# Patient Record
Sex: Male | Born: 1955 | Race: White | Hispanic: Yes | Marital: Married | Smoking: Former smoker
Health system: Southern US, Community
[De-identification: ages and names within clinical notes are randomized; demographics above are authoritative.]

## PROBLEM LIST (undated history)

## (undated) ENCOUNTER — Ambulatory Visit
Admission: RE | Disposition: A | Discharge status: Home / Self Care | Payer: BC Managed Care – PPO | Source: Ambulatory Visit | Attending: Emergency Medicine | Admitting: Emergency Medicine

## (undated) ENCOUNTER — Ambulatory Visit: Discharge status: Absent without leave | Payer: BC Managed Care – PPO

## (undated) DIAGNOSIS — C61 Malignant neoplasm of prostate: Secondary | ICD-10-CM

## (undated) DIAGNOSIS — C8330 Diffuse large B-cell lymphoma, unspecified site: Secondary | ICD-10-CM

## (undated) DIAGNOSIS — K219 Gastro-esophageal reflux disease without esophagitis: Secondary | ICD-10-CM

## (undated) DIAGNOSIS — C801 Malignant (primary) neoplasm, unspecified: Secondary | ICD-10-CM

## (undated) HISTORY — DX: Malignant neoplasm of prostate: C61

## (undated) HISTORY — PX: HERNIA REPAIR: SHX51

## (undated) HISTORY — PX: OTHER SURGICAL HISTORY: SHX169

## (undated) HISTORY — PX: COLONOSCOPY: SHX174

## (undated) HISTORY — DX: Gastro-esophageal reflux disease without esophagitis: K21.9

## (undated) HISTORY — DX: Diffuse large B-cell lymphoma, unspecified site: C83.30

---

## 2009-10-15 ENCOUNTER — Ambulatory Visit (HOSPITAL_COMMUNITY): Admission: RE | Admit: 2009-10-15 | Discharge: 2009-10-15 | Payer: Self-pay | Admitting: Family Medicine

## 2011-01-19 ENCOUNTER — Other Ambulatory Visit (HOSPITAL_COMMUNITY): Payer: Self-pay | Admitting: Urology

## 2011-01-19 DIAGNOSIS — C61 Malignant neoplasm of prostate: Secondary | ICD-10-CM

## 2011-01-20 ENCOUNTER — Encounter (HOSPITAL_COMMUNITY)
Admission: RE | Admit: 2011-01-20 | Discharge: 2011-01-20 | Disposition: A | Discharge status: Home / Self Care | Payer: 59 | Source: Ambulatory Visit | Attending: Urology | Admitting: Urology

## 2011-01-20 ENCOUNTER — Encounter (HOSPITAL_COMMUNITY): Payer: Self-pay

## 2011-01-20 DIAGNOSIS — C61 Malignant neoplasm of prostate: Secondary | ICD-10-CM

## 2011-01-20 HISTORY — DX: Malignant (primary) neoplasm, unspecified: C80.1

## 2011-01-20 MED ORDER — TECHNETIUM TC 99M MEDRONATE IV KIT
25.0000 | PACK | Freq: Once | INTRAVENOUS | Status: AC | PRN
  Administered 2011-01-20: 24.5 via INTRAVENOUS

## 2011-02-11 DIAGNOSIS — C61 Malignant neoplasm of prostate: Secondary | ICD-10-CM | POA: Insufficient documentation

## 2011-11-15 ENCOUNTER — Other Ambulatory Visit (HOSPITAL_COMMUNITY): Payer: Self-pay | Admitting: Internal Medicine

## 2011-11-15 DIAGNOSIS — K219 Gastro-esophageal reflux disease without esophagitis: Secondary | ICD-10-CM

## 2011-11-15 DIAGNOSIS — R1013 Epigastric pain: Secondary | ICD-10-CM

## 2011-11-16 ENCOUNTER — Ambulatory Visit (HOSPITAL_COMMUNITY)
Admission: RE | Admit: 2011-11-16 | Discharge: 2011-11-16 | Disposition: A | Discharge status: Home / Self Care | Payer: 59 | Source: Ambulatory Visit | Attending: Internal Medicine | Admitting: Internal Medicine

## 2011-11-16 DIAGNOSIS — K219 Gastro-esophageal reflux disease without esophagitis: Secondary | ICD-10-CM | POA: Insufficient documentation

## 2011-11-16 DIAGNOSIS — K8020 Calculus of gallbladder without cholecystitis without obstruction: Secondary | ICD-10-CM | POA: Insufficient documentation

## 2011-11-16 DIAGNOSIS — R1013 Epigastric pain: Secondary | ICD-10-CM | POA: Insufficient documentation

## 2011-11-18 ENCOUNTER — Other Ambulatory Visit (HOSPITAL_COMMUNITY): Payer: 59

## 2011-11-24 ENCOUNTER — Encounter (INDEPENDENT_AMBULATORY_CARE_PROVIDER_SITE_OTHER): Payer: Self-pay | Admitting: *Deleted

## 2011-11-29 ENCOUNTER — Ambulatory Visit (INDEPENDENT_AMBULATORY_CARE_PROVIDER_SITE_OTHER): Payer: 59 | Admitting: Internal Medicine

## 2011-11-29 ENCOUNTER — Encounter (INDEPENDENT_AMBULATORY_CARE_PROVIDER_SITE_OTHER): Payer: Self-pay | Admitting: Internal Medicine

## 2011-11-29 VITALS — BP 90/58 | HR 66 | Temp 97.9°F | Ht 65.0 in | Wt 190.1 lb

## 2011-11-29 DIAGNOSIS — R9389 Abnormal findings on diagnostic imaging of other specified body structures: Secondary | ICD-10-CM

## 2011-11-29 DIAGNOSIS — A048 Other specified bacterial intestinal infections: Secondary | ICD-10-CM

## 2011-11-29 DIAGNOSIS — R1013 Epigastric pain: Secondary | ICD-10-CM

## 2011-11-29 DIAGNOSIS — K8689 Other specified diseases of pancreas: Secondary | ICD-10-CM

## 2011-11-29 DIAGNOSIS — K869 Disease of pancreas, unspecified: Secondary | ICD-10-CM

## 2011-11-29 NOTE — Patient Instructions (Addendum)
MRI abdomen

## 2011-11-29 NOTE — Progress Notes (Addendum)
Subjective:     Patient ID: Brandon Norton, male   DOB: 1955/07/08, 56 y.o.   MRN: [id number]  [facility] to our [facility]. He has been hurting for 3-4 months.  Recently noted to be H. Pylori positive and antibiotic tx has been started. He has not lost any weight. His appetite is good.  He has epigastric pain which comes and goes. Sometimes the pain is worse after eating. He does have some early satiety 11/13/2011 NA 137, K 3.9, Bili 1.2, ALP 91, AST 19, ALT 34.  Hem. 12.9, HCT 36.7, MCV 88.9. H. Pylori detected.   11/13/2010 Amylase 39, Lipase 21 11/15/11 US abdomen: IMPRESSION:  1. Cystic lesion and pancreatic head. Possible hypoechoic mass in  the pancreatic body. Pancreatic adenocarcinoma or cystic  pancreatic malignancy not completely excluded. Pancreatic protocol  MRI with and without contrast recommended for further  characterization.  2. Heterogeneous liver, likely due to fatty infiltration with  geographic fatty sparing.  3. Questionable focal distal dilatation the common bile duct,  query type 1 choledochal cyst.  4. Multiple gallstones. Mild gallbladder wall thickening without  pericholecystic fluid or sonographic Murphy's sign. Correlate  clinically in assessing for acute cholecystitis.  Review of Systems see hpi Current Outpatient Prescriptions  Medication Sig Dispense Refill  . amoxicillin (AMOXIL) 500 MG capsule Take 500 mg by mouth 2 (two) times daily.      . clarithromycin (BIAXIN) 500 MG tablet Take 500 mg by mouth 2 (two) times daily.      Marland Kitchen esomeprazole (NEXIUM) 40 MG capsule Take 40 mg by mouth daily before breakfast.      . nabumetone (RELAFEN) 750 MG tablet Take 750 mg by mouth daily.      . Tamsulosin HCl (FLOMAX) 0.4 MG CAPS Take by mouth.       History reviewed. No pertinent past surgical history. Past Medical History  Diagnosis Date  . Cancer   . Prostate cancer   . GERD (gastroesophageal reflux disease)    No  Known Allergies     . Objective:   Physical Exam Filed Vitals:   11/29/11 1051  BP: 90/58  Pulse: 66  Temp: 97.9 F (36.6 C)  Height: 5\' 5"  (1.651 m)  Weight: 190 lb 1.6 oz (86.229 kg)   Alert and oriented. Skin warm and dry. Oral mucosa is moist.   . Sclera anicteric, conjunctivae is pink. Thyroid not enlarged. No cervical lymphadenopathy. Lungs clear. Heart regular rate and rhythm.  Abdomen is soft. Bowel sounds are positive. No hepatomegaly. No abdominal masses felt. He has epigastric tenderness.  No edema to lower extremities.       Assessment:  Epigastric tenderness. H. Pylori positive and presently taking antibiotics. Abnormal [place] with hypoechoic mass in the pancreatic body. CBD dilatation up to 1.1cm. I discussed this case with Dr. Karilyn Cota.  Pancreatic adenocarcinoma needs to be ruled out. Also CBD stone needs to be ruled out. After MRI, may need cholecystectomy    Plan:     MRI abdomen w/wo contrast pancreatic protocol.  Further recommendations to follow. Possible cholecystectomy.

## 2011-12-08 ENCOUNTER — Other Ambulatory Visit (HOSPITAL_COMMUNITY): Payer: 59

## 2011-12-14 ENCOUNTER — Encounter (INDEPENDENT_AMBULATORY_CARE_PROVIDER_SITE_OTHER): Payer: Self-pay

## 2011-12-14 ENCOUNTER — Ambulatory Visit (HOSPITAL_COMMUNITY)
Admission: RE | Admit: 2011-12-14 | Discharge: 2011-12-14 | Disposition: A | Discharge status: Home / Self Care | Payer: 59 | Source: Ambulatory Visit | Attending: Internal Medicine | Admitting: Internal Medicine

## 2011-12-14 ENCOUNTER — Other Ambulatory Visit (INDEPENDENT_AMBULATORY_CARE_PROVIDER_SITE_OTHER): Payer: Self-pay | Admitting: Internal Medicine

## 2011-12-14 DIAGNOSIS — K8689 Other specified diseases of pancreas: Secondary | ICD-10-CM

## 2011-12-14 DIAGNOSIS — R1013 Epigastric pain: Secondary | ICD-10-CM

## 2011-12-14 DIAGNOSIS — K8020 Calculus of gallbladder without cholecystitis without obstruction: Secondary | ICD-10-CM | POA: Insufficient documentation

## 2011-12-14 MED ORDER — GADOBENATE DIMEGLUMINE 529 MG/ML IV SOLN
18.0000 mL | Freq: Once | INTRAVENOUS | Status: AC | PRN
  Administered 2011-12-14: 18 mL via INTRAVENOUS

## 2011-12-16 ENCOUNTER — Telehealth (INDEPENDENT_AMBULATORY_CARE_PROVIDER_SITE_OTHER): Payer: Self-pay | Admitting: *Deleted

## 2011-12-16 NOTE — Telephone Encounter (Signed)
Trenden's daughter, Donn Pierini, received a call two days ago and called back yesterday left a message. She has not spoke with another about the results from her dads MRI. Then today she received a call from [facility] stating our office referred her dad there. She is needing to speak with someone to see what is going on please. Her return phone number is [phone].

## 2011-12-16 NOTE — Telephone Encounter (Signed)
This has been addressed.

## 2011-12-27 ENCOUNTER — Telehealth (INDEPENDENT_AMBULATORY_CARE_PROVIDER_SITE_OTHER): Payer: Self-pay | Admitting: Internal Medicine

## 2011-12-27 NOTE — Telephone Encounter (Signed)
Opened in error

## 2012-01-28 ENCOUNTER — Other Ambulatory Visit (HOSPITAL_COMMUNITY): Payer: Self-pay | Admitting: Oncology

## 2012-01-28 ENCOUNTER — Encounter (HOSPITAL_COMMUNITY): Payer: Self-pay | Admitting: Oncology

## 2012-01-28 DIAGNOSIS — C8330 Diffuse large B-cell lymphoma, unspecified site: Secondary | ICD-10-CM

## 2012-01-28 HISTORY — DX: Diffuse large B-cell lymphoma, unspecified site: C83.30

## 2012-01-28 MED ORDER — PEGFILGRASTIM INJECTION 6 MG/0.6ML: 6.0000 mg | Freq: Once | SUBCUTANEOUS | Status: DC

## 2012-02-03 ENCOUNTER — Other Ambulatory Visit (HOSPITAL_COMMUNITY): Payer: 59

## 2012-02-03 ENCOUNTER — Encounter (HOSPITAL_COMMUNITY): Payer: 59 | Attending: Oncology

## 2012-02-03 ENCOUNTER — Encounter (HOSPITAL_BASED_OUTPATIENT_CLINIC_OR_DEPARTMENT_OTHER): Payer: 59

## 2012-02-03 VITALS — BP 107/67 | HR 62 | Temp 98.0°F | Resp 16

## 2012-02-03 DIAGNOSIS — C8330 Diffuse large B-cell lymphoma, unspecified site: Secondary | ICD-10-CM

## 2012-02-03 DIAGNOSIS — C8589 Other specified types of non-Hodgkin lymphoma, extranodal and solid organ sites: Secondary | ICD-10-CM

## 2012-02-03 LAB — CBC WITH DIFFERENTIAL/PLATELET
Basophils Relative: 0 % (ref 0–1)
HCT: 28.4 % — ABNORMAL LOW (ref 39.0–52.0)
Hemoglobin: 9.8 g/dL — ABNORMAL LOW (ref 13.0–17.0)
Lymphocytes Relative: 17 % (ref 12–46)
MCHC: 34.5 g/dL (ref 30.0–36.0)
Monocytes Relative: 0 % — ABNORMAL LOW (ref 3–12)
Neutro Abs: 3.6 10*3/uL (ref 1.7–7.7)
Neutrophils Relative %: 79 % — ABNORMAL HIGH (ref 43–77)
RBC: 3.21 MIL/uL — ABNORMAL LOW (ref 4.22–5.81)
WBC: 4.6 10*3/uL (ref 4.0–10.5)

## 2012-02-03 LAB — COMPREHENSIVE METABOLIC PANEL
AST: 11 U/L (ref 0–37)
Albumin: 2.7 g/dL — ABNORMAL LOW (ref 3.5–5.2)
Alkaline Phosphatase: 74 U/L (ref 39–117)
BUN: 9 mg/dL (ref 6–23)
CO2: 33 mEq/L — ABNORMAL HIGH (ref 19–32)
Chloride: 97 mEq/L (ref 96–112)
GFR calc non Af Amer: >90 mL/min (ref >90)
Potassium: 3.1 mEq/L — ABNORMAL LOW (ref 3.5–5.1)
Total Bilirubin: 0.4 mg/dL (ref 0.3–1.2)

## 2012-02-03 LAB — MAGNESIUM: Magnesium: 2.1 mg/dL (ref 1.5–2.5)

## 2012-02-03 MED ORDER — PEGFILGRASTIM INJECTION 6 MG/0.6ML
SUBCUTANEOUS | Status: AC
  Filled 2012-02-03: qty 0.6

## 2012-02-03 MED ORDER — PEGFILGRASTIM INJECTION 6 MG/0.6ML
6.0000 mg | Freq: Once | SUBCUTANEOUS | Status: AC
  Administered 2012-02-03: 6 mg via SUBCUTANEOUS

## 2012-02-03 NOTE — Progress Notes (Signed)
Brandon Norton presents today for injection per MD orders. Neulasta 6mg  administered SQ in right Abdomen. Administration without incident. Patient tolerated well. Patient's daughter called to inform of lab results and lab results sent to [facility] per number provided.

## 2012-02-03 NOTE — Progress Notes (Signed)
Labs drawn today for cbc/diff,cmp,mg 

## 2012-02-07 ENCOUNTER — Other Ambulatory Visit (HOSPITAL_COMMUNITY): Payer: 59

## 2012-02-10 ENCOUNTER — Other Ambulatory Visit (HOSPITAL_COMMUNITY): Payer: 59

## 2012-02-14 ENCOUNTER — Other Ambulatory Visit (HOSPITAL_COMMUNITY): Payer: 59

## 2012-02-17 ENCOUNTER — Other Ambulatory Visit (HOSPITAL_COMMUNITY): Payer: Self-pay | Admitting: Oncology

## 2012-02-17 DIAGNOSIS — C8330 Diffuse large B-cell lymphoma, unspecified site: Secondary | ICD-10-CM

## 2012-02-23 ENCOUNTER — Encounter (HOSPITAL_BASED_OUTPATIENT_CLINIC_OR_DEPARTMENT_OTHER): Payer: 59

## 2012-02-23 ENCOUNTER — Encounter (HOSPITAL_COMMUNITY): Payer: 59 | Attending: Oncology

## 2012-02-23 VITALS — BP 107/68 | HR 74 | Temp 97.5°F

## 2012-02-23 DIAGNOSIS — C8589 Other specified types of non-Hodgkin lymphoma, extranodal and solid organ sites: Secondary | ICD-10-CM

## 2012-02-23 DIAGNOSIS — Z5189 Encounter for other specified aftercare: Secondary | ICD-10-CM

## 2012-02-23 DIAGNOSIS — C8330 Diffuse large B-cell lymphoma, unspecified site: Secondary | ICD-10-CM

## 2012-02-23 LAB — COMPREHENSIVE METABOLIC PANEL
ALT: 15 U/L (ref 0–53)
Albumin: 3.4 g/dL — ABNORMAL LOW (ref 3.5–5.2)
Calcium: 8.9 mg/dL (ref 8.4–10.5)
GFR calc Af Amer: >90 mL/min (ref >90)
Glucose, Bld: 119 mg/dL — ABNORMAL HIGH (ref 70–99)
Potassium: 3 mEq/L — ABNORMAL LOW (ref 3.5–5.1)
Sodium: 138 mEq/L (ref 135–145)
Total Protein: 6.7 g/dL (ref 6.0–8.3)

## 2012-02-23 LAB — CBC WITH DIFFERENTIAL/PLATELET
Basophils Absolute: 0 10*3/uL (ref 0.0–0.1)
Basophils Relative: 0 % (ref 0–1)
Eosinophils Absolute: 0.1 10*3/uL (ref 0.0–0.7)
Eosinophils Relative: 2 % (ref 0–5)
Lymphs Abs: 0.8 10*3/uL (ref 0.7–4.0)
MCH: 30 pg (ref 26.0–34.0)
MCHC: 34.1 g/dL (ref 30.0–36.0)
MCV: 87.8 fL (ref 78.0–100.0)
Neutrophils Relative %: 81 % — ABNORMAL HIGH (ref 43–77)
Platelets: 328 10*3/uL (ref 150–400)
RDW: 13.4 % (ref 11.5–15.5)

## 2012-02-23 MED ORDER — PEGFILGRASTIM INJECTION 6 MG/0.6ML
SUBCUTANEOUS | Status: AC
  Filled 2012-02-23: qty 0.6

## 2012-02-23 MED ORDER — PEGFILGRASTIM INJECTION 6 MG/0.6ML
6.0000 mg | Freq: Once | SUBCUTANEOUS | Status: AC
  Administered 2012-02-23: 6 mg via SUBCUTANEOUS

## 2012-02-23 NOTE — Progress Notes (Signed)
Labs drawn today for cbc/diff,cmp,mg 

## 2012-02-23 NOTE — Progress Notes (Signed)
Lab results faxed to [facility].

## 2012-02-23 NOTE — Progress Notes (Signed)
Brandon Norton presents today for injection per the provider's orders.  Neulasta administered administration without incident; see MAR for injection details.  Patient tolerated procedure well and without incident.  No questions or complaints noted at this time.

## 2012-02-28 ENCOUNTER — Encounter (HOSPITAL_BASED_OUTPATIENT_CLINIC_OR_DEPARTMENT_OTHER): Payer: 59

## 2012-02-28 DIAGNOSIS — C8589 Other specified types of non-Hodgkin lymphoma, extranodal and solid organ sites: Secondary | ICD-10-CM

## 2012-02-28 DIAGNOSIS — C8330 Diffuse large B-cell lymphoma, unspecified site: Secondary | ICD-10-CM

## 2012-02-28 LAB — COMPREHENSIVE METABOLIC PANEL
BUN: 5 mg/dL — ABNORMAL LOW (ref 6–23)
Calcium: 9.4 mg/dL (ref 8.4–10.5)
Creatinine, Ser: 0.59 mg/dL (ref 0.50–1.35)
GFR calc Af Amer: >90 mL/min (ref >90)
Glucose, Bld: 115 mg/dL — ABNORMAL HIGH (ref 70–99)
Total Protein: 7.2 g/dL (ref 6.0–8.3)

## 2012-02-28 LAB — CBC WITH DIFFERENTIAL/PLATELET
Basophils Absolute: 0.1 10*3/uL (ref 0.0–0.1)
Basophils Relative: 2 % — ABNORMAL HIGH (ref 0–1)
Eosinophils Absolute: 0.1 10*3/uL (ref 0.0–0.7)
HCT: 27.4 % — ABNORMAL LOW (ref 39.0–52.0)
Hemoglobin: 9.3 g/dL — ABNORMAL LOW (ref 13.0–17.0)
MCH: 29.8 pg (ref 26.0–34.0)
MCHC: 33.9 g/dL (ref 30.0–36.0)
Monocytes Absolute: 0.7 10*3/uL (ref 0.1–1.0)
Monocytes Relative: 19 % — ABNORMAL HIGH (ref 3–12)
RDW: 13.5 % (ref 11.5–15.5)

## 2012-02-28 LAB — MAGNESIUM: Magnesium: 2 mg/dL (ref 1.5–2.5)

## 2012-02-28 NOTE — Progress Notes (Signed)
Labs drawn today for cbc/diff,cmp,mg 

## 2012-03-02 ENCOUNTER — Encounter (HOSPITAL_BASED_OUTPATIENT_CLINIC_OR_DEPARTMENT_OTHER): Payer: 59

## 2012-03-02 DIAGNOSIS — C8330 Diffuse large B-cell lymphoma, unspecified site: Secondary | ICD-10-CM

## 2012-03-02 DIAGNOSIS — C8589 Other specified types of non-Hodgkin lymphoma, extranodal and solid organ sites: Secondary | ICD-10-CM

## 2012-03-02 LAB — CBC WITH DIFFERENTIAL/PLATELET
Basophils Relative: 1 % (ref 0–1)
Hemoglobin: 9.7 g/dL — ABNORMAL LOW (ref 13.0–17.0)
Lymphocytes Relative: 16 % (ref 12–46)
Lymphs Abs: 1.1 10*3/uL (ref 0.7–4.0)
MCHC: 33.3 g/dL (ref 30.0–36.0)
Monocytes Relative: 11 % (ref 3–12)
Neutro Abs: 4.8 10*3/uL (ref 1.7–7.7)
Neutrophils Relative %: 69 % (ref 43–77)
RBC: 3.24 MIL/uL — ABNORMAL LOW (ref 4.22–5.81)
WBC: 7 10*3/uL (ref 4.0–10.5)

## 2012-03-02 LAB — COMPREHENSIVE METABOLIC PANEL
ALT: 11 U/L (ref 0–53)
AST: 11 U/L (ref 0–37)
CO2: 30 mEq/L (ref 19–32)
Calcium: 9.5 mg/dL (ref 8.4–10.5)
GFR calc non Af Amer: >90 mL/min (ref >90)
Sodium: 139 mEq/L (ref 135–145)
Total Protein: 6.9 g/dL (ref 6.0–8.3)

## 2012-03-02 LAB — MAGNESIUM: Magnesium: 2 mg/dL (ref 1.5–2.5)

## 2012-03-02 NOTE — Progress Notes (Signed)
Labs drawn today for cbc/diff,cmp,mg 

## 2012-03-06 ENCOUNTER — Encounter (HOSPITAL_BASED_OUTPATIENT_CLINIC_OR_DEPARTMENT_OTHER): Payer: 59

## 2012-03-06 DIAGNOSIS — C8589 Other specified types of non-Hodgkin lymphoma, extranodal and solid organ sites: Secondary | ICD-10-CM

## 2012-03-06 DIAGNOSIS — C8330 Diffuse large B-cell lymphoma, unspecified site: Secondary | ICD-10-CM

## 2012-03-06 LAB — CBC WITH DIFFERENTIAL/PLATELET
Basophils Relative: 1 % (ref 0–1)
Eosinophils Absolute: 0.3 10*3/uL (ref 0.0–0.7)
Eosinophils Relative: 4 % (ref 0–5)
HCT: 31 % — ABNORMAL LOW (ref 39.0–52.0)
Hemoglobin: 10.2 g/dL — ABNORMAL LOW (ref 13.0–17.0)
MCH: 30.2 pg (ref 26.0–34.0)
MCHC: 32.9 g/dL (ref 30.0–36.0)
Monocytes Absolute: 0.6 10*3/uL (ref 0.1–1.0)
Monocytes Relative: 8 % (ref 3–12)

## 2012-03-06 LAB — COMPREHENSIVE METABOLIC PANEL
Alkaline Phosphatase: 108 U/L (ref 39–117)
BUN: 8 mg/dL (ref 6–23)
CO2: 29 mEq/L (ref 19–32)
Chloride: 101 mEq/L (ref 96–112)
GFR calc Af Amer: >90 mL/min (ref >90)
Glucose, Bld: 110 mg/dL — ABNORMAL HIGH (ref 70–99)
Potassium: 3.8 mEq/L (ref 3.5–5.1)
Total Bilirubin: 0.3 mg/dL (ref 0.3–1.2)

## 2012-03-06 LAB — MAGNESIUM: Magnesium: 2.1 mg/dL (ref 1.5–2.5)

## 2012-03-06 NOTE — Progress Notes (Signed)
Labs drawn today for cbc/diff,cmp,mg 

## 2012-03-15 ENCOUNTER — Encounter (HOSPITAL_COMMUNITY): Payer: 59 | Attending: Oncology

## 2012-03-15 ENCOUNTER — Other Ambulatory Visit (HOSPITAL_COMMUNITY): Payer: 59

## 2012-03-15 ENCOUNTER — Encounter (HOSPITAL_COMMUNITY): Payer: 59

## 2012-03-15 VITALS — BP 99/68 | HR 74

## 2012-03-15 DIAGNOSIS — C8330 Diffuse large B-cell lymphoma, unspecified site: Secondary | ICD-10-CM

## 2012-03-15 DIAGNOSIS — Z5189 Encounter for other specified aftercare: Secondary | ICD-10-CM

## 2012-03-15 DIAGNOSIS — C8589 Other specified types of non-Hodgkin lymphoma, extranodal and solid organ sites: Secondary | ICD-10-CM | POA: Insufficient documentation

## 2012-03-15 LAB — CBC WITH DIFFERENTIAL/PLATELET
Basophils Relative: 0 % (ref 0–1)
Eosinophils Absolute: 0 10*3/uL (ref 0.0–0.7)
Eosinophils Relative: 1 % (ref 0–5)
MCH: 32 pg (ref 26.0–34.0)
MCHC: 36.5 g/dL — ABNORMAL HIGH (ref 30.0–36.0)
Monocytes Relative: 0 % — ABNORMAL LOW (ref 3–12)
Neutrophils Relative %: 90 % — ABNORMAL HIGH (ref 43–77)
Platelets: 349 10*3/uL (ref 150–400)

## 2012-03-15 LAB — COMPREHENSIVE METABOLIC PANEL
Albumin: 3.5 g/dL (ref 3.5–5.2)
Alkaline Phosphatase: 79 U/L (ref 39–117)
BUN: 11 mg/dL (ref 6–23)
Calcium: 8.8 mg/dL (ref 8.4–10.5)
Potassium: 3.3 mEq/L — ABNORMAL LOW (ref 3.5–5.1)
Total Protein: 6.8 g/dL (ref 6.0–8.3)

## 2012-03-15 MED ORDER — PEGFILGRASTIM INJECTION 6 MG/0.6ML
SUBCUTANEOUS | Status: AC
  Filled 2012-03-15: qty 0.6

## 2012-03-15 MED ORDER — PALONOSETRON HCL INJECTION 0.25 MG/5ML
INTRAVENOUS | Status: AC
  Filled 2012-03-15: qty 5

## 2012-03-15 MED ORDER — PEGFILGRASTIM INJECTION 6 MG/0.6ML
6.0000 mg | Freq: Once | SUBCUTANEOUS | Status: AC
  Administered 2012-03-15: 6 mg via SUBCUTANEOUS

## 2012-03-15 NOTE — Progress Notes (Signed)
Brandon Norton presents today for injection per MD orders. Neulasta 6mg  administered SQ in left Abdomen. Administration without incident. Patient tolerated well. Specimen withdrawn peripherally from right ac.  Tolerated well.

## 2012-03-20 ENCOUNTER — Encounter (HOSPITAL_BASED_OUTPATIENT_CLINIC_OR_DEPARTMENT_OTHER): Payer: 59

## 2012-03-20 ENCOUNTER — Other Ambulatory Visit (HOSPITAL_COMMUNITY): Payer: Self-pay | Admitting: Oncology

## 2012-03-20 DIAGNOSIS — C8330 Diffuse large B-cell lymphoma, unspecified site: Secondary | ICD-10-CM

## 2012-03-20 DIAGNOSIS — C8589 Other specified types of non-Hodgkin lymphoma, extranodal and solid organ sites: Secondary | ICD-10-CM

## 2012-03-20 LAB — COMPREHENSIVE METABOLIC PANEL
ALT: 31 U/L (ref 0–53)
CO2: 28 mEq/L (ref 19–32)
Calcium: 9 mg/dL (ref 8.4–10.5)
Chloride: 96 mEq/L (ref 96–112)
Creatinine, Ser: 0.58 mg/dL (ref 0.50–1.35)
GFR calc Af Amer: >90 mL/min (ref >90)
GFR calc non Af Amer: >90 mL/min (ref >90)
Glucose, Bld: 129 mg/dL — ABNORMAL HIGH (ref 70–99)
Sodium: 134 mEq/L — ABNORMAL LOW (ref 135–145)
Total Bilirubin: 0.4 mg/dL (ref 0.3–1.2)

## 2012-03-20 LAB — CBC WITH DIFFERENTIAL/PLATELET
Basophils Relative: 1 % (ref 0–1)
Eosinophils Relative: 3 % (ref 0–5)
Hemoglobin: 9 g/dL — ABNORMAL LOW (ref 13.0–17.0)
Lymphs Abs: 0.5 10*3/uL — ABNORMAL LOW (ref 0.7–4.0)
MCH: 30.7 pg (ref 26.0–34.0)
MCV: 88.7 fL (ref 78.0–100.0)
Monocytes Absolute: 0.4 10*3/uL (ref 0.1–1.0)
Monocytes Relative: 20 % — ABNORMAL HIGH (ref 3–12)
RBC: 2.93 MIL/uL — ABNORMAL LOW (ref 4.22–5.81)
WBC: 1.9 10*3/uL — ABNORMAL LOW (ref 4.0–10.5)

## 2012-03-20 NOTE — Progress Notes (Signed)
Brandon Norton presented for [employer]. Labs per MD order drawn via Peripheral Line 23 gauge needle inserted in Right AC  Good blood return present. Procedure without incident.  Needle removed intact. Patient tolerated procedure well.

## 2012-03-23 ENCOUNTER — Encounter (HOSPITAL_BASED_OUTPATIENT_CLINIC_OR_DEPARTMENT_OTHER): Payer: 59

## 2012-03-23 DIAGNOSIS — C8330 Diffuse large B-cell lymphoma, unspecified site: Secondary | ICD-10-CM

## 2012-03-23 DIAGNOSIS — C8589 Other specified types of non-Hodgkin lymphoma, extranodal and solid organ sites: Secondary | ICD-10-CM

## 2012-03-23 LAB — COMPREHENSIVE METABOLIC PANEL
ALT: 19 U/L (ref 0–53)
Albumin: 3.5 g/dL (ref 3.5–5.2)
Alkaline Phosphatase: 120 U/L — ABNORMAL HIGH (ref 39–117)
BUN: 6 mg/dL (ref 6–23)
Chloride: 98 mEq/L (ref 96–112)
GFR calc Af Amer: >90 mL/min (ref >90)
Glucose, Bld: 113 mg/dL — ABNORMAL HIGH (ref 70–99)
Potassium: 3.7 mEq/L (ref 3.5–5.1)
Sodium: 137 mEq/L (ref 135–145)
Total Bilirubin: 0.3 mg/dL (ref 0.3–1.2)
Total Protein: 6.8 g/dL (ref 6.0–8.3)

## 2012-03-23 LAB — CBC WITH DIFFERENTIAL/PLATELET
Basophils Absolute: 0.1 10*3/uL (ref 0.0–0.1)
Basophils Relative: 2 % — ABNORMAL HIGH (ref 0–1)
Eosinophils Absolute: 0.2 10*3/uL (ref 0.0–0.7)
Eosinophils Relative: 3 % (ref 0–5)
Lymphs Abs: 0.8 10*3/uL (ref 0.7–4.0)
MCH: 30.6 pg (ref 26.0–34.0)
MCV: 89.9 fL (ref 78.0–100.0)
Neutrophils Relative %: 65 % (ref 43–77)
Platelets: 201 10*3/uL (ref 150–400)
RBC: 2.97 MIL/uL — ABNORMAL LOW (ref 4.22–5.81)
RDW: 15.6 % — ABNORMAL HIGH (ref 11.5–15.5)

## 2012-03-23 LAB — MAGNESIUM: Magnesium: 2 mg/dL (ref 1.5–2.5)

## 2012-03-23 NOTE — Progress Notes (Signed)
Labs drawn today for cbc/diff,cmp,mg 

## 2012-03-27 ENCOUNTER — Encounter (HOSPITAL_BASED_OUTPATIENT_CLINIC_OR_DEPARTMENT_OTHER): Payer: 59

## 2012-03-27 DIAGNOSIS — C8589 Other specified types of non-Hodgkin lymphoma, extranodal and solid organ sites: Secondary | ICD-10-CM

## 2012-03-27 DIAGNOSIS — C8330 Diffuse large B-cell lymphoma, unspecified site: Secondary | ICD-10-CM

## 2012-03-27 LAB — COMPREHENSIVE METABOLIC PANEL
AST: 14 U/L (ref 0–37)
Albumin: 3.6 g/dL (ref 3.5–5.2)
BUN: 9 mg/dL (ref 6–23)
Calcium: 9.2 mg/dL (ref 8.4–10.5)
Creatinine, Ser: 0.72 mg/dL (ref 0.50–1.35)
Total Bilirubin: 0.3 mg/dL (ref 0.3–1.2)
Total Protein: 6.8 g/dL (ref 6.0–8.3)

## 2012-03-27 LAB — MAGNESIUM: Magnesium: 2.2 mg/dL (ref 1.5–2.5)

## 2012-03-27 LAB — CBC WITH DIFFERENTIAL/PLATELET
Basophils Absolute: 0.1 K/uL (ref 0.0–0.1)
Basophils Relative: 1 % (ref 0–1)
Eosinophils Absolute: 0.2 K/uL (ref 0.0–0.7)
Eosinophils Relative: 2 % (ref 0–5)
HCT: 29.7 % — ABNORMAL LOW (ref 39.0–52.0)
Hemoglobin: 10 g/dL — ABNORMAL LOW (ref 13.0–17.0)
Lymphocytes Relative: 11 % — ABNORMAL LOW (ref 12–46)
Lymphs Abs: 1 K/uL (ref 0.7–4.0)
MCH: 30.5 pg (ref 26.0–34.0)
MCHC: 33.7 g/dL (ref 30.0–36.0)
MCV: 90.5 fL (ref 78.0–100.0)
Monocytes Absolute: 0.7 K/uL (ref 0.1–1.0)
Monocytes Relative: 8 % (ref 3–12)
Neutro Abs: 7 K/uL (ref 1.7–7.7)
Neutrophils Relative %: 78 % — ABNORMAL HIGH (ref 43–77)
Platelets: 337 K/uL (ref 150–400)
RBC: 3.28 MIL/uL — ABNORMAL LOW (ref 4.22–5.81)
RDW: 16.3 % — ABNORMAL HIGH (ref 11.5–15.5)
WBC: 8.9 K/uL (ref 4.0–10.5)

## 2012-03-27 NOTE — Progress Notes (Signed)
Labs drawn today for cbc/diff,cmp,mg 

## 2012-03-31 ENCOUNTER — Other Ambulatory Visit (HOSPITAL_COMMUNITY): Payer: Self-pay | Admitting: Oncology

## 2012-03-31 DIAGNOSIS — C8330 Diffuse large B-cell lymphoma, unspecified site: Secondary | ICD-10-CM

## 2012-04-05 ENCOUNTER — Encounter (HOSPITAL_BASED_OUTPATIENT_CLINIC_OR_DEPARTMENT_OTHER): Payer: 59

## 2012-04-05 VITALS — BP 90/59 | HR 53 | Temp 98.2°F | Resp 16

## 2012-04-05 DIAGNOSIS — C8330 Diffuse large B-cell lymphoma, unspecified site: Secondary | ICD-10-CM

## 2012-04-05 DIAGNOSIS — C8589 Other specified types of non-Hodgkin lymphoma, extranodal and solid organ sites: Secondary | ICD-10-CM

## 2012-04-05 DIAGNOSIS — Z5189 Encounter for other specified aftercare: Secondary | ICD-10-CM

## 2012-04-05 LAB — CBC WITH DIFFERENTIAL/PLATELET
Lymphocytes Relative: 8 % — ABNORMAL LOW (ref 12–46)
Lymphs Abs: 0.7 10*3/uL (ref 0.7–4.0)
Neutro Abs: 7.7 10*3/uL (ref 1.7–7.7)
Neutrophils Relative %: 91 % — ABNORMAL HIGH (ref 43–77)
Platelets: 283 10*3/uL (ref 150–400)
RBC: 3.24 MIL/uL — ABNORMAL LOW (ref 4.22–5.81)
WBC: 8.4 10*3/uL (ref 4.0–10.5)

## 2012-04-05 LAB — COMPREHENSIVE METABOLIC PANEL
ALT: 40 U/L (ref 0–53)
Alkaline Phosphatase: 65 U/L (ref 39–117)
CO2: 33 mEq/L — ABNORMAL HIGH (ref 19–32)
Chloride: 99 mEq/L (ref 96–112)
GFR calc Af Amer: >90 mL/min (ref >90)
Glucose, Bld: 106 mg/dL — ABNORMAL HIGH (ref 70–99)
Potassium: 3.2 mEq/L — ABNORMAL LOW (ref 3.5–5.1)
Sodium: 137 mEq/L (ref 135–145)
Total Protein: 6.5 g/dL (ref 6.0–8.3)

## 2012-04-05 LAB — MAGNESIUM: Magnesium: 2.5 mg/dL (ref 1.5–2.5)

## 2012-04-05 MED ORDER — PEGFILGRASTIM INJECTION 6 MG/0.6ML
SUBCUTANEOUS | Status: AC
  Filled 2012-04-05: qty 0.6

## 2012-04-05 MED ORDER — PEGFILGRASTIM INJECTION 6 MG/0.6ML
6.0000 mg | Freq: Once | SUBCUTANEOUS | Status: AC
  Administered 2012-04-05: 6 mg via SUBCUTANEOUS

## 2012-04-05 NOTE — Progress Notes (Signed)
Labs drawn today for cbc/dif,cmp,mg

## 2012-04-05 NOTE — Progress Notes (Signed)
Brandon Norton presents today for injection per MD orders. Neulasta 6mg  administered SQ in left Abdomen. Administration without incident. Patient tolerated well.

## 2012-04-10 ENCOUNTER — Encounter (HOSPITAL_BASED_OUTPATIENT_CLINIC_OR_DEPARTMENT_OTHER): Payer: 59

## 2012-04-10 ENCOUNTER — Telehealth (HOSPITAL_COMMUNITY): Payer: Self-pay | Admitting: Oncology

## 2012-04-10 DIAGNOSIS — C8330 Diffuse large B-cell lymphoma, unspecified site: Secondary | ICD-10-CM

## 2012-04-10 DIAGNOSIS — C8589 Other specified types of non-Hodgkin lymphoma, extranodal and solid organ sites: Secondary | ICD-10-CM

## 2012-04-10 LAB — COMPREHENSIVE METABOLIC PANEL
Albumin: 3.5 g/dL (ref 3.5–5.2)
Alkaline Phosphatase: 74 U/L (ref 39–117)
BUN: 10 mg/dL (ref 6–23)
CO2: 30 mEq/L (ref 19–32)
Chloride: 98 mEq/L (ref 96–112)
Creatinine, Ser: 0.57 mg/dL (ref 0.50–1.35)
GFR calc non Af Amer: >90 mL/min (ref >90)
Potassium: 4.1 mEq/L (ref 3.5–5.1)
Total Bilirubin: 0.5 mg/dL (ref 0.3–1.2)

## 2012-04-10 LAB — CBC WITH DIFFERENTIAL/PLATELET
Basophils Relative: 2 % — ABNORMAL HIGH (ref 0–1)
HCT: 26.4 % — ABNORMAL LOW (ref 39.0–52.0)
Hemoglobin: 9 g/dL — ABNORMAL LOW (ref 13.0–17.0)
Lymphocytes Relative: 45 % (ref 12–46)
Lymphs Abs: 0.5 10*3/uL — ABNORMAL LOW (ref 0.7–4.0)
MCHC: 34.1 g/dL (ref 30.0–36.0)
Monocytes Absolute: 0.3 10*3/uL (ref 0.1–1.0)
Monocytes Relative: 31 % — ABNORMAL HIGH (ref 3–12)
Neutro Abs: 0.2 10*3/uL — ABNORMAL LOW (ref 1.7–7.7)
Neutrophils Relative %: 19 % — ABNORMAL LOW (ref 43–77)
RBC: 2.97 MIL/uL — ABNORMAL LOW (ref 4.22–5.81)
WBC: 1.1 10*3/uL — CL (ref 4.0–10.5)

## 2012-04-10 LAB — MAGNESIUM: Magnesium: 2.1 mg/dL (ref 1.5–2.5)

## 2012-04-10 NOTE — Telephone Encounter (Signed)
CRITICAL VALUE ALERT Critical value received:  WBC 1.1 Date of notification:  .today  Time of notification: 1045 Critical value read back:  yes Nurse who received alert:  Dawnette Mione, Blair Hailey, RN MD notified (1st page):  Dr. Caroline Sauger  .today 11:01 AM Dr. Benson Norway at [facility] advised of pt's WBC ct.  Remaining labs to be faxed once all are resulted.  No further follow-up given at present.

## 2012-04-10 NOTE — Progress Notes (Signed)
Labs drawn today for cbc/diff,cmp,mg 

## 2012-04-11 ENCOUNTER — Encounter (INDEPENDENT_AMBULATORY_CARE_PROVIDER_SITE_OTHER): Payer: Self-pay

## 2012-04-13 ENCOUNTER — Telehealth (HOSPITAL_COMMUNITY): Payer: Self-pay | Admitting: *Deleted

## 2012-04-13 ENCOUNTER — Encounter (HOSPITAL_COMMUNITY): Payer: 59 | Attending: Oncology

## 2012-04-13 DIAGNOSIS — C8330 Diffuse large B-cell lymphoma, unspecified site: Secondary | ICD-10-CM

## 2012-04-13 DIAGNOSIS — C8589 Other specified types of non-Hodgkin lymphoma, extranodal and solid organ sites: Secondary | ICD-10-CM | POA: Insufficient documentation

## 2012-04-13 LAB — COMPREHENSIVE METABOLIC PANEL
ALT: 16 U/L (ref 0–53)
AST: 12 U/L (ref 0–37)
Albumin: 3.6 g/dL (ref 3.5–5.2)
Alkaline Phosphatase: 108 U/L (ref 39–117)
Calcium: 9.2 mg/dL (ref 8.4–10.5)
GFR calc Af Amer: >90 mL/min (ref >90)
Glucose, Bld: 110 mg/dL — ABNORMAL HIGH (ref 70–99)
Potassium: 3.8 mEq/L (ref 3.5–5.1)
Sodium: 139 mEq/L (ref 135–145)
Total Protein: 6.5 g/dL (ref 6.0–8.3)

## 2012-04-13 LAB — CBC WITH DIFFERENTIAL/PLATELET
Basophils Absolute: 0.1 10*3/uL (ref 0.0–0.1)
Eosinophils Absolute: 0.1 10*3/uL (ref 0.0–0.7)
Lymphocytes Relative: 15 % (ref 12–46)
Lymphs Abs: 0.8 10*3/uL (ref 0.7–4.0)
MCH: 30.4 pg (ref 26.0–34.0)
Neutrophils Relative %: 74 % (ref 43–77)
Platelets: 158 10*3/uL (ref 150–400)
RBC: 2.96 MIL/uL — ABNORMAL LOW (ref 4.22–5.81)
RDW: 16.9 % — ABNORMAL HIGH (ref 11.5–15.5)
WBC: 5.5 10*3/uL (ref 4.0–10.5)

## 2012-04-13 NOTE — Telephone Encounter (Signed)
Labs faxed to [facility]

## 2012-04-13 NOTE — Progress Notes (Signed)
Labs drawn today for cbc/diff,cmp,mg 

## 2012-04-17 ENCOUNTER — Encounter (HOSPITAL_BASED_OUTPATIENT_CLINIC_OR_DEPARTMENT_OTHER): Payer: 59

## 2012-04-17 DIAGNOSIS — C8589 Other specified types of non-Hodgkin lymphoma, extranodal and solid organ sites: Secondary | ICD-10-CM

## 2012-04-17 DIAGNOSIS — C8330 Diffuse large B-cell lymphoma, unspecified site: Secondary | ICD-10-CM

## 2012-04-17 LAB — CBC WITH DIFFERENTIAL/PLATELET
Basophils Relative: 1 % (ref 0–1)
Eosinophils Absolute: 0 10*3/uL (ref 0.0–0.7)
Eosinophils Relative: 1 % (ref 0–5)
HCT: 27.1 % — ABNORMAL LOW (ref 39.0–52.0)
Hemoglobin: 9.2 g/dL — ABNORMAL LOW (ref 13.0–17.0)
Lymphs Abs: 1.2 10*3/uL (ref 0.7–4.0)
MCH: 31.6 pg (ref 26.0–34.0)
MCHC: 33.9 g/dL (ref 30.0–36.0)
MCV: 93.1 fL (ref 78.0–100.0)
Monocytes Absolute: 0.7 10*3/uL (ref 0.1–1.0)
Monocytes Relative: 8 % (ref 3–12)
RBC: 2.91 MIL/uL — ABNORMAL LOW (ref 4.22–5.81)

## 2012-04-17 LAB — COMPREHENSIVE METABOLIC PANEL
AST: 14 U/L (ref 0–37)
Albumin: 3.4 g/dL — ABNORMAL LOW (ref 3.5–5.2)
Calcium: 8.9 mg/dL (ref 8.4–10.5)
Creatinine, Ser: 0.63 mg/dL (ref 0.50–1.35)
Total Protein: 6.4 g/dL (ref 6.0–8.3)

## 2012-04-17 NOTE — Progress Notes (Signed)
Labs drawn today for cbc/diff,cmp,mg 

## 2012-04-24 ENCOUNTER — Other Ambulatory Visit (HOSPITAL_COMMUNITY): Payer: Self-pay | Admitting: Oncology

## 2012-04-24 DIAGNOSIS — C8330 Diffuse large B-cell lymphoma, unspecified site: Secondary | ICD-10-CM

## 2012-04-26 ENCOUNTER — Encounter (HOSPITAL_COMMUNITY): Payer: 59

## 2012-04-26 ENCOUNTER — Encounter (HOSPITAL_BASED_OUTPATIENT_CLINIC_OR_DEPARTMENT_OTHER): Payer: 59

## 2012-04-26 ENCOUNTER — Other Ambulatory Visit (HOSPITAL_COMMUNITY): Payer: 59

## 2012-04-26 VITALS — BP 97/60 | HR 61 | Temp 98.2°F | Resp 16

## 2012-04-26 DIAGNOSIS — Z5189 Encounter for other specified aftercare: Secondary | ICD-10-CM

## 2012-04-26 DIAGNOSIS — C8330 Diffuse large B-cell lymphoma, unspecified site: Secondary | ICD-10-CM

## 2012-04-26 DIAGNOSIS — C8589 Other specified types of non-Hodgkin lymphoma, extranodal and solid organ sites: Secondary | ICD-10-CM

## 2012-04-26 LAB — CBC WITH DIFFERENTIAL/PLATELET
Basophils Absolute: 0 10*3/uL (ref 0.0–0.1)
Eosinophils Relative: 0 % (ref 0–5)
Lymphocytes Relative: 12 % (ref 12–46)
Lymphs Abs: 0.6 10*3/uL — ABNORMAL LOW (ref 0.7–4.0)
MCV: 87.7 fL (ref 78.0–100.0)
Neutro Abs: 4.6 10*3/uL (ref 1.7–7.7)
Neutrophils Relative %: 88 % — ABNORMAL HIGH (ref 43–77)
Platelets: 262 10*3/uL (ref 150–400)
RBC: 3.66 MIL/uL — ABNORMAL LOW (ref 4.22–5.81)
RDW: 16.8 % — ABNORMAL HIGH (ref 11.5–15.5)
WBC: 5.2 10*3/uL (ref 4.0–10.5)

## 2012-04-26 LAB — COMPREHENSIVE METABOLIC PANEL
ALT: 18 U/L (ref 0–53)
AST: 14 U/L (ref 0–37)
Alkaline Phosphatase: 58 U/L (ref 39–117)
CO2: 27 mEq/L (ref 19–32)
Calcium: 8.2 mg/dL — ABNORMAL LOW (ref 8.4–10.5)
Chloride: 98 mEq/L (ref 96–112)
GFR calc Af Amer: >90 mL/min (ref >90)
GFR calc non Af Amer: >90 mL/min (ref >90)
Glucose, Bld: 118 mg/dL — ABNORMAL HIGH (ref 70–99)
Potassium: 3.3 mEq/L — ABNORMAL LOW (ref 3.5–5.1)
Sodium: 134 mEq/L — ABNORMAL LOW (ref 135–145)
Total Bilirubin: 0.4 mg/dL (ref 0.3–1.2)

## 2012-04-26 MED ORDER — PEGFILGRASTIM INJECTION 6 MG/0.6ML
6.0000 mg | Freq: Once | SUBCUTANEOUS | Status: AC
  Administered 2012-04-26: 6 mg via SUBCUTANEOUS

## 2012-04-26 MED ORDER — PEGFILGRASTIM INJECTION 6 MG/0.6ML
SUBCUTANEOUS | Status: AC
  Filled 2012-04-26: qty 0.6

## 2012-04-26 NOTE — Progress Notes (Signed)
Brandon Norton presented for [employer]. Labs per MD order drawn via Peripheral Line 23 gauge needle inserted in right antecubital.  Good blood return present. Procedure without incident.  Needle removed intact. Patient tolerated procedure well.  Brandon Norton presents today for injection per MD orders. Neulasta 6mg  administered SQ in right Abdomen. Administration without incident. Patient tolerated well.

## 2012-05-01 ENCOUNTER — Encounter (HOSPITAL_COMMUNITY): Payer: 59

## 2012-05-01 LAB — CBC WITH DIFFERENTIAL/PLATELET
Eosinophils Absolute: 0 10*3/uL (ref 0.0–0.7)
Eosinophils Relative: 2 % (ref 0–5)
Lymphs Abs: 0.6 10*3/uL — ABNORMAL LOW (ref 0.7–4.0)
MCH: 30.9 pg (ref 26.0–34.0)
MCHC: 34.6 g/dL (ref 30.0–36.0)
MCV: 89.1 fL (ref 78.0–100.0)
Monocytes Absolute: 0.4 10*3/uL (ref 0.1–1.0)
Neutrophils Relative %: 30 % — ABNORMAL LOW (ref 43–77)
Platelets: 108 10*3/uL — ABNORMAL LOW (ref 150–400)
RBC: 3.5 MIL/uL — ABNORMAL LOW (ref 4.22–5.81)

## 2012-05-01 LAB — COMPREHENSIVE METABOLIC PANEL
CO2: 26 mEq/L (ref 19–32)
Calcium: 9.5 mg/dL (ref 8.4–10.5)
Creatinine, Ser: 0.59 mg/dL (ref 0.50–1.35)
GFR calc Af Amer: >90 mL/min (ref >90)
GFR calc non Af Amer: >90 mL/min (ref >90)
Glucose, Bld: 120 mg/dL — ABNORMAL HIGH (ref 70–99)
Total Protein: 6.8 g/dL (ref 6.0–8.3)

## 2012-05-04 ENCOUNTER — Encounter (HOSPITAL_BASED_OUTPATIENT_CLINIC_OR_DEPARTMENT_OTHER): Payer: 59

## 2012-05-04 DIAGNOSIS — C8589 Other specified types of non-Hodgkin lymphoma, extranodal and solid organ sites: Secondary | ICD-10-CM

## 2012-05-04 DIAGNOSIS — C8330 Diffuse large B-cell lymphoma, unspecified site: Secondary | ICD-10-CM

## 2012-05-04 LAB — CBC WITH DIFFERENTIAL/PLATELET
Basophils Absolute: 0.1 10*3/uL (ref 0.0–0.1)
HCT: 31.1 % — ABNORMAL LOW (ref 39.0–52.0)
Hemoglobin: 10.6 g/dL — ABNORMAL LOW (ref 13.0–17.0)
Lymphocytes Relative: 15 % (ref 12–46)
Monocytes Absolute: 0.6 10*3/uL (ref 0.1–1.0)
Neutro Abs: 3.1 10*3/uL (ref 1.7–7.7)
RDW: 16 % — ABNORMAL HIGH (ref 11.5–15.5)
WBC: 4.5 10*3/uL (ref 4.0–10.5)

## 2012-05-04 LAB — COMPREHENSIVE METABOLIC PANEL
ALT: 14 U/L (ref 0–53)
AST: 13 U/L (ref 0–37)
Alkaline Phosphatase: 79 U/L (ref 39–117)
CO2: 29 mEq/L (ref 19–32)
Chloride: 101 mEq/L (ref 96–112)
Creatinine, Ser: 0.63 mg/dL (ref 0.50–1.35)
GFR calc non Af Amer: >90 mL/min (ref >90)
Total Bilirubin: 0.3 mg/dL (ref 0.3–1.2)

## 2012-05-04 LAB — MAGNESIUM: Magnesium: 2.1 mg/dL (ref 1.5–2.5)

## 2012-05-04 NOTE — Progress Notes (Signed)
Labs drawn today for cbc/diff,cmp,mg 

## 2012-05-08 ENCOUNTER — Encounter (HOSPITAL_BASED_OUTPATIENT_CLINIC_OR_DEPARTMENT_OTHER): Payer: 59

## 2012-05-08 ENCOUNTER — Other Ambulatory Visit (HOSPITAL_COMMUNITY): Payer: Self-pay | Admitting: Oncology

## 2012-05-08 DIAGNOSIS — C8330 Diffuse large B-cell lymphoma, unspecified site: Secondary | ICD-10-CM

## 2012-05-08 DIAGNOSIS — C8589 Other specified types of non-Hodgkin lymphoma, extranodal and solid organ sites: Secondary | ICD-10-CM

## 2012-05-08 LAB — CBC WITH DIFFERENTIAL/PLATELET
Basophils Absolute: 0 10*3/uL (ref 0.0–0.1)
Basophils Relative: 0 % (ref 0–1)
Eosinophils Absolute: 0.1 10*3/uL (ref 0.0–0.7)
Eosinophils Relative: 1 % (ref 0–5)
MCH: 31.1 pg (ref 26.0–34.0)
MCHC: 34.1 g/dL (ref 30.0–36.0)
MCV: 91.2 fL (ref 78.0–100.0)
Platelets: 244 10*3/uL (ref 150–400)
RDW: 16.4 % — ABNORMAL HIGH (ref 11.5–15.5)
WBC: 9.3 10*3/uL (ref 4.0–10.5)

## 2012-05-08 LAB — COMPREHENSIVE METABOLIC PANEL
ALT: 11 U/L (ref 0–53)
AST: 11 U/L (ref 0–37)
Calcium: 9.3 mg/dL (ref 8.4–10.5)
Creatinine, Ser: 0.66 mg/dL (ref 0.50–1.35)
Sodium: 138 mEq/L (ref 135–145)
Total Protein: 6.7 g/dL (ref 6.0–8.3)

## 2012-05-08 NOTE — Progress Notes (Signed)
Labs drawn today for cbc/diff,cmp,mg 

## 2012-05-17 ENCOUNTER — Encounter (HOSPITAL_BASED_OUTPATIENT_CLINIC_OR_DEPARTMENT_OTHER): Payer: 59

## 2012-05-17 ENCOUNTER — Encounter (HOSPITAL_COMMUNITY): Payer: 59 | Attending: Oncology

## 2012-05-17 ENCOUNTER — Other Ambulatory Visit (HOSPITAL_COMMUNITY): Payer: Self-pay | Admitting: Oncology

## 2012-05-17 VITALS — BP 97/57 | HR 71 | Temp 98.0°F | Resp 16

## 2012-05-17 DIAGNOSIS — Z5189 Encounter for other specified aftercare: Secondary | ICD-10-CM

## 2012-05-17 DIAGNOSIS — C8330 Diffuse large B-cell lymphoma, unspecified site: Secondary | ICD-10-CM

## 2012-05-17 DIAGNOSIS — C8589 Other specified types of non-Hodgkin lymphoma, extranodal and solid organ sites: Secondary | ICD-10-CM | POA: Insufficient documentation

## 2012-05-17 LAB — CBC WITH DIFFERENTIAL/PLATELET
Eosinophils Absolute: 0 10*3/uL (ref 0.0–0.7)
Eosinophils Relative: 0 % (ref 0–5)
Hemoglobin: 9.7 g/dL — ABNORMAL LOW (ref 13.0–17.0)
Lymphocytes Relative: 8 % — ABNORMAL LOW (ref 12–46)
Lymphs Abs: 0.5 10*3/uL — ABNORMAL LOW (ref 0.7–4.0)
MCH: 31.4 pg (ref 26.0–34.0)
MCV: 90.3 fL (ref 78.0–100.0)
Monocytes Relative: 1 % — ABNORMAL LOW (ref 3–12)
Neutrophils Relative %: 92 % — ABNORMAL HIGH (ref 43–77)
RBC: 3.09 MIL/uL — ABNORMAL LOW (ref 4.22–5.81)
WBC: 6.5 10*3/uL (ref 4.0–10.5)

## 2012-05-17 LAB — COMPREHENSIVE METABOLIC PANEL
ALT: 15 U/L (ref 0–53)
Alkaline Phosphatase: 59 U/L (ref 39–117)
BUN: 17 mg/dL (ref 6–23)
CO2: 31 mEq/L (ref 19–32)
GFR calc Af Amer: >90 mL/min (ref >90)
GFR calc non Af Amer: >90 mL/min (ref >90)
Glucose, Bld: 130 mg/dL — ABNORMAL HIGH (ref 70–99)
Potassium: 3.4 mEq/L — ABNORMAL LOW (ref 3.5–5.1)
Sodium: 139 mEq/L (ref 135–145)
Total Bilirubin: 0.3 mg/dL (ref 0.3–1.2)
Total Protein: 6.1 g/dL (ref 6.0–8.3)

## 2012-05-17 LAB — MAGNESIUM: Magnesium: 2.4 mg/dL (ref 1.5–2.5)

## 2012-05-17 MED ORDER — PEGFILGRASTIM INJECTION 6 MG/0.6ML
6.0000 mg | Freq: Once | SUBCUTANEOUS | Status: AC
  Administered 2012-05-17: 6 mg via SUBCUTANEOUS

## 2012-05-17 MED ORDER — PEGFILGRASTIM INJECTION 6 MG/0.6ML
SUBCUTANEOUS | Status: AC
  Filled 2012-05-17: qty 0.6

## 2012-05-17 NOTE — Progress Notes (Signed)
Labs drawn today for cbc/diff,cmp,mg 

## 2012-05-17 NOTE — Progress Notes (Signed)
Brandon Norton presents today for injection per MD orders. Neulasta 6mg  administered SQ in right Abdomen. Administration without incident. Patient tolerated well.

## 2012-05-22 ENCOUNTER — Encounter (HOSPITAL_BASED_OUTPATIENT_CLINIC_OR_DEPARTMENT_OTHER): Payer: 59

## 2012-05-22 DIAGNOSIS — C8589 Other specified types of non-Hodgkin lymphoma, extranodal and solid organ sites: Secondary | ICD-10-CM

## 2012-05-22 DIAGNOSIS — C8330 Diffuse large B-cell lymphoma, unspecified site: Secondary | ICD-10-CM

## 2012-05-22 LAB — CBC WITH DIFFERENTIAL/PLATELET
HCT: 25.2 % — ABNORMAL LOW (ref 39.0–52.0)
Hemoglobin: 8.8 g/dL — ABNORMAL LOW (ref 13.0–17.0)
Lymphocytes Relative: 42 % (ref 12–46)
Monocytes Absolute: 0.3 10*3/uL (ref 0.1–1.0)
Monocytes Relative: 28 % — ABNORMAL HIGH (ref 3–12)
Neutro Abs: 0.3 10*3/uL — ABNORMAL LOW (ref 1.7–7.7)
Neutrophils Relative %: 26 % — ABNORMAL LOW (ref 43–77)
RBC: 2.77 MIL/uL — ABNORMAL LOW (ref 4.22–5.81)
WBC: 1.1 10*3/uL — CL (ref 4.0–10.5)

## 2012-05-22 LAB — COMPREHENSIVE METABOLIC PANEL
AST: 8 U/L (ref 0–37)
Albumin: 3.6 g/dL (ref 3.5–5.2)
Alkaline Phosphatase: 71 U/L (ref 39–117)
BUN: 12 mg/dL (ref 6–23)
CO2: 30 mEq/L (ref 19–32)
Chloride: 97 mEq/L (ref 96–112)
Creatinine, Ser: 0.65 mg/dL (ref 0.50–1.35)
GFR calc non Af Amer: >90 mL/min (ref >90)
Potassium: 3.8 mEq/L (ref 3.5–5.1)
Total Bilirubin: 0.5 mg/dL (ref 0.3–1.2)

## 2012-05-22 NOTE — Progress Notes (Signed)
Labs drawn today for cbc/diff,cmp,mg 

## 2012-05-25 ENCOUNTER — Encounter (HOSPITAL_BASED_OUTPATIENT_CLINIC_OR_DEPARTMENT_OTHER): Payer: 59

## 2012-05-25 DIAGNOSIS — C8589 Other specified types of non-Hodgkin lymphoma, extranodal and solid organ sites: Secondary | ICD-10-CM

## 2012-05-25 LAB — CBC
HCT: 25.2 % — ABNORMAL LOW (ref 39.0–52.0)
MCH: 32.2 pg (ref 26.0–34.0)
MCV: 94.4 fL (ref 78.0–100.0)
Platelets: 114 10*3/uL — ABNORMAL LOW (ref 150–400)
RBC: 2.67 MIL/uL — ABNORMAL LOW (ref 4.22–5.81)

## 2012-05-25 LAB — DIFFERENTIAL
Basophils Absolute: 0.1 10*3/uL (ref 0.0–0.1)
Basophils Relative: 1 % (ref 0–1)
Lymphocytes Relative: 15 % (ref 12–46)
Neutro Abs: 3.3 10*3/uL (ref 1.7–7.7)
Neutrophils Relative %: 71 % (ref 43–77)

## 2012-05-25 LAB — COMPREHENSIVE METABOLIC PANEL
AST: 11 U/L (ref 0–37)
BUN: 11 mg/dL (ref 6–23)
CO2: 28 mEq/L (ref 19–32)
Calcium: 8.9 mg/dL (ref 8.4–10.5)
Chloride: 101 mEq/L (ref 96–112)
Creatinine, Ser: 0.68 mg/dL (ref 0.50–1.35)
GFR calc Af Amer: >90 mL/min (ref >90)
GFR calc non Af Amer: >90 mL/min (ref >90)
Glucose, Bld: 118 mg/dL — ABNORMAL HIGH (ref 70–99)
Total Bilirubin: 0.2 mg/dL — ABNORMAL LOW (ref 0.3–1.2)

## 2012-05-25 LAB — MAGNESIUM: Magnesium: 2 mg/dL (ref 1.5–2.5)

## 2012-05-25 NOTE — Progress Notes (Signed)
Labs drawn today for cbc/diff,cmp,mg 

## 2012-05-29 ENCOUNTER — Encounter (HOSPITAL_BASED_OUTPATIENT_CLINIC_OR_DEPARTMENT_OTHER): Payer: 59

## 2012-05-29 DIAGNOSIS — C8589 Other specified types of non-Hodgkin lymphoma, extranodal and solid organ sites: Secondary | ICD-10-CM

## 2012-05-29 LAB — DIFFERENTIAL
Basophils Relative: 1 % (ref 0–1)
Eosinophils Absolute: 0.1 10*3/uL (ref 0.0–0.7)
Lymphs Abs: 0.6 10*3/uL — ABNORMAL LOW (ref 0.7–4.0)
Monocytes Absolute: 0.5 10*3/uL (ref 0.1–1.0)
Monocytes Relative: 9 % (ref 3–12)
Neutro Abs: 4.4 10*3/uL (ref 1.7–7.7)
Neutrophils Relative %: 79 % — ABNORMAL HIGH (ref 43–77)

## 2012-05-29 LAB — COMPREHENSIVE METABOLIC PANEL
AST: 11 U/L (ref 0–37)
Albumin: 3.6 g/dL (ref 3.5–5.2)
Alkaline Phosphatase: 76 U/L (ref 39–117)
BUN: 10 mg/dL (ref 6–23)
Chloride: 104 mEq/L (ref 96–112)
Potassium: 3.3 mEq/L — ABNORMAL LOW (ref 3.5–5.1)
Total Bilirubin: 0.2 mg/dL — ABNORMAL LOW (ref 0.3–1.2)
Total Protein: 6.5 g/dL (ref 6.0–8.3)

## 2012-05-29 LAB — CBC
HCT: 27.7 % — ABNORMAL LOW (ref 39.0–52.0)
MCHC: 33.6 g/dL (ref 30.0–36.0)
Platelets: 275 10*3/uL (ref 150–400)
RDW: 16.3 % — ABNORMAL HIGH (ref 11.5–15.5)
WBC: 5.6 10*3/uL (ref 4.0–10.5)

## 2012-05-29 NOTE — Progress Notes (Signed)
Labs drawn today for cbc/diff,cmp,mg 

## 2012-05-29 NOTE — Progress Notes (Signed)
Labs faxed to [facility] 

## 2012-06-01 ENCOUNTER — Encounter (HOSPITAL_BASED_OUTPATIENT_CLINIC_OR_DEPARTMENT_OTHER): Payer: 59

## 2012-06-01 DIAGNOSIS — C8589 Other specified types of non-Hodgkin lymphoma, extranodal and solid organ sites: Secondary | ICD-10-CM

## 2012-06-01 LAB — COMPREHENSIVE METABOLIC PANEL
BUN: 12 mg/dL (ref 6–23)
CO2: 26 mEq/L (ref 19–32)
Calcium: 8.7 mg/dL (ref 8.4–10.5)
Chloride: 108 mEq/L (ref 96–112)
Creatinine, Ser: 0.53 mg/dL (ref 0.50–1.35)
GFR calc Af Amer: >90 mL/min (ref >90)
GFR calc non Af Amer: >90 mL/min (ref >90)
Total Bilirubin: 0.2 mg/dL — ABNORMAL LOW (ref 0.3–1.2)

## 2012-06-01 LAB — MAGNESIUM: Magnesium: 1.9 mg/dL (ref 1.5–2.5)

## 2012-06-01 LAB — DIFFERENTIAL
Basophils Relative: 1 % (ref 0–1)
Eosinophils Absolute: 0 10*3/uL (ref 0.0–0.7)
Lymphs Abs: 0.6 10*3/uL — ABNORMAL LOW (ref 0.7–4.0)
Neutrophils Relative %: 74 % (ref 43–77)

## 2012-06-01 LAB — CBC
HCT: 26.9 % — ABNORMAL LOW (ref 39.0–52.0)
MCH: 32.5 pg (ref 26.0–34.0)
MCV: 97.1 fL (ref 78.0–100.0)
RBC: 2.77 MIL/uL — ABNORMAL LOW (ref 4.22–5.81)
WBC: 4.2 10*3/uL (ref 4.0–10.5)

## 2012-06-01 NOTE — Progress Notes (Signed)
Labs drawn today for cbc/diff,cmp,mg 

## 2012-06-06 ENCOUNTER — Encounter (HOSPITAL_BASED_OUTPATIENT_CLINIC_OR_DEPARTMENT_OTHER): Payer: 59

## 2012-06-06 DIAGNOSIS — C8589 Other specified types of non-Hodgkin lymphoma, extranodal and solid organ sites: Secondary | ICD-10-CM

## 2012-06-06 LAB — COMPREHENSIVE METABOLIC PANEL
AST: 17 U/L (ref 0–37)
Albumin: 3.6 g/dL (ref 3.5–5.2)
Alkaline Phosphatase: 69 U/L (ref 39–117)
Chloride: 103 mEq/L (ref 96–112)
Potassium: 3.5 mEq/L (ref 3.5–5.1)
Total Bilirubin: 0.4 mg/dL (ref 0.3–1.2)

## 2012-06-06 LAB — DIFFERENTIAL
Basophils Relative: 1 % (ref 0–1)
Eosinophils Absolute: 0 10*3/uL (ref 0.0–0.7)
Monocytes Absolute: 0.6 10*3/uL (ref 0.1–1.0)
Monocytes Relative: 11 % (ref 3–12)
Neutro Abs: 4.8 10*3/uL (ref 1.7–7.7)

## 2012-06-06 LAB — CBC
Platelets: 307 10*3/uL (ref 150–400)
RDW: 16.9 % — ABNORMAL HIGH (ref 11.5–15.5)
WBC: 6.1 10*3/uL (ref 4.0–10.5)

## 2012-06-06 NOTE — Progress Notes (Signed)
Labs drawn today for cbc/diff,cmp,mg 

## 2012-06-08 ENCOUNTER — Encounter (HOSPITAL_BASED_OUTPATIENT_CLINIC_OR_DEPARTMENT_OTHER): Payer: 59

## 2012-06-08 DIAGNOSIS — C8589 Other specified types of non-Hodgkin lymphoma, extranodal and solid organ sites: Secondary | ICD-10-CM

## 2012-06-08 LAB — DIFFERENTIAL
Basophils Absolute: 0.1 10*3/uL (ref 0.0–0.1)
Basophils Relative: 1 % (ref 0–1)
Eosinophils Absolute: 0 10*3/uL (ref 0.0–0.7)
Eosinophils Relative: 1 % (ref 0–5)
Lymphocytes Relative: 19 % (ref 12–46)
Lymphs Abs: 0.7 10*3/uL (ref 0.7–4.0)
Monocytes Absolute: 0.5 10*3/uL (ref 0.1–1.0)
Monocytes Relative: 14 % — ABNORMAL HIGH (ref 3–12)
Neutro Abs: 2.4 10*3/uL (ref 1.7–7.7)
Neutrophils Relative %: 66 % (ref 43–77)

## 2012-06-08 LAB — COMPREHENSIVE METABOLIC PANEL
AST: 20 U/L (ref 0–37)
BUN: 10 mg/dL (ref 6–23)
CO2: 29 mEq/L (ref 19–32)
Calcium: 9.3 mg/dL (ref 8.4–10.5)
Creatinine, Ser: 0.62 mg/dL (ref 0.50–1.35)
GFR calc Af Amer: >90 mL/min (ref >90)
GFR calc non Af Amer: >90 mL/min (ref >90)

## 2012-06-08 LAB — CBC
MCH: 32.9 pg (ref 26.0–34.0)
MCV: 98.3 fL (ref 78.0–100.0)
Platelets: 302 10*3/uL (ref 150–400)
RBC: 2.98 MIL/uL — ABNORMAL LOW (ref 4.22–5.81)

## 2012-06-08 LAB — MAGNESIUM: Magnesium: 1.8 mg/dL (ref 1.5–2.5)

## 2012-06-08 NOTE — Progress Notes (Signed)
Labs drawn today for cbc/diff,cmp,mg 

## 2012-06-12 ENCOUNTER — Encounter (HOSPITAL_COMMUNITY): Payer: 59 | Attending: Oncology

## 2012-06-12 DIAGNOSIS — C8589 Other specified types of non-Hodgkin lymphoma, extranodal and solid organ sites: Secondary | ICD-10-CM | POA: Insufficient documentation

## 2012-06-12 LAB — COMPREHENSIVE METABOLIC PANEL
ALT: 15 U/L (ref 0–53)
AST: 19 U/L (ref 0–37)
Albumin: 3.5 g/dL (ref 3.5–5.2)
CO2: 27 mEq/L (ref 19–32)
Calcium: 9.1 mg/dL (ref 8.4–10.5)
Creatinine, Ser: 0.61 mg/dL (ref 0.50–1.35)
GFR calc non Af Amer: >90 mL/min (ref >90)
Sodium: 139 mEq/L (ref 135–145)
Total Protein: 6.2 g/dL (ref 6.0–8.3)

## 2012-06-12 LAB — DIFFERENTIAL
Basophils Relative: 1 % (ref 0–1)
Eosinophils Absolute: 0.1 10*3/uL (ref 0.0–0.7)
Eosinophils Relative: 2 % (ref 0–5)
Lymphocytes Relative: 21 % (ref 12–46)
Monocytes Relative: 10 % (ref 3–12)
Neutro Abs: 3.2 10*3/uL (ref 1.7–7.7)

## 2012-06-12 LAB — CBC
Hemoglobin: 9.6 g/dL — ABNORMAL LOW (ref 13.0–17.0)
RBC: 2.88 MIL/uL — ABNORMAL LOW (ref 4.22–5.81)
WBC: 4.8 10*3/uL (ref 4.0–10.5)

## 2012-06-12 LAB — MAGNESIUM: Magnesium: 2 mg/dL (ref 1.5–2.5)

## 2012-06-12 NOTE — Progress Notes (Signed)
Labs drawn today for cbc/diff,cmp,mg 

## 2012-06-15 ENCOUNTER — Encounter (HOSPITAL_BASED_OUTPATIENT_CLINIC_OR_DEPARTMENT_OTHER): Payer: 59

## 2012-06-15 DIAGNOSIS — C8589 Other specified types of non-Hodgkin lymphoma, extranodal and solid organ sites: Secondary | ICD-10-CM

## 2012-06-15 LAB — COMPREHENSIVE METABOLIC PANEL
AST: 18 U/L (ref 0–37)
Albumin: 3.6 g/dL (ref 3.5–5.2)
Chloride: 106 mEq/L (ref 96–112)
Creatinine, Ser: 0.59 mg/dL (ref 0.50–1.35)
Total Bilirubin: 0.3 mg/dL (ref 0.3–1.2)
Total Protein: 6.2 g/dL (ref 6.0–8.3)

## 2012-06-15 LAB — DIFFERENTIAL
Lymphocytes Relative: 20 % (ref 12–46)
Lymphs Abs: 0.9 10*3/uL (ref 0.7–4.0)
Monocytes Absolute: 0.4 10*3/uL (ref 0.1–1.0)
Monocytes Relative: 9 % (ref 3–12)
Neutro Abs: 3 10*3/uL (ref 1.7–7.7)
Neutrophils Relative %: 67 % (ref 43–77)

## 2012-06-15 LAB — CBC
MCHC: 33.3 g/dL (ref 30.0–36.0)
MCV: 99.7 fL (ref 78.0–100.0)
Platelets: 244 10*3/uL (ref 150–400)
RDW: 15.5 % (ref 11.5–15.5)
WBC: 4.5 10*3/uL (ref 4.0–10.5)

## 2012-06-15 NOTE — Progress Notes (Signed)
Labs drawn today for cbc/diff,cmp,mg 

## 2012-06-19 ENCOUNTER — Encounter (HOSPITAL_BASED_OUTPATIENT_CLINIC_OR_DEPARTMENT_OTHER): Payer: 59

## 2012-06-19 DIAGNOSIS — C8589 Other specified types of non-Hodgkin lymphoma, extranodal and solid organ sites: Secondary | ICD-10-CM

## 2012-06-19 LAB — CBC
HCT: 31.8 % — ABNORMAL LOW (ref 39.0–52.0)
Hemoglobin: 10.7 g/dL — ABNORMAL LOW (ref 13.0–17.0)
MCH: 33.4 pg (ref 26.0–34.0)
MCV: 99.4 fL (ref 78.0–100.0)
RBC: 3.2 MIL/uL — ABNORMAL LOW (ref 4.22–5.81)
WBC: 5.2 10*3/uL (ref 4.0–10.5)

## 2012-06-19 LAB — DIFFERENTIAL
Basophils Absolute: 0 10*3/uL (ref 0.0–0.1)
Basophils Relative: 0 % (ref 0–1)
Eosinophils Relative: 3 % (ref 0–5)
Lymphocytes Relative: 12 % (ref 12–46)
Monocytes Absolute: 0.4 10*3/uL (ref 0.1–1.0)
Neutro Abs: 4 10*3/uL (ref 1.7–7.7)

## 2012-06-19 LAB — COMPREHENSIVE METABOLIC PANEL
AST: 21 U/L (ref 0–37)
CO2: 31 mEq/L (ref 19–32)
Calcium: 9.4 mg/dL (ref 8.4–10.5)
Chloride: 104 mEq/L (ref 96–112)
Creatinine, Ser: 0.64 mg/dL (ref 0.50–1.35)
GFR calc Af Amer: >90 mL/min (ref >90)
GFR calc non Af Amer: >90 mL/min (ref >90)
Glucose, Bld: 116 mg/dL — ABNORMAL HIGH (ref 70–99)
Total Bilirubin: 0.3 mg/dL (ref 0.3–1.2)

## 2012-06-19 LAB — MAGNESIUM: Magnesium: 2 mg/dL (ref 1.5–2.5)

## 2012-06-19 NOTE — Progress Notes (Signed)
Labs drawn today for cbc/diff,cmp,mg 

## 2013-02-15 ENCOUNTER — Encounter (HOSPITAL_COMMUNITY)
Admission: RE | Admit: 2013-02-15 | Discharge: 2013-02-15 | Disposition: A | Discharge status: Home / Self Care | Payer: 59 | Source: Ambulatory Visit | Attending: Hematology and Oncology | Admitting: Hematology and Oncology

## 2013-02-15 DIAGNOSIS — C8589 Other specified types of non-Hodgkin lymphoma, extranodal and solid organ sites: Secondary | ICD-10-CM | POA: Insufficient documentation

## 2013-02-15 MED ORDER — HEPARIN SOD (PORK) LOCK FLUSH 100 UNIT/ML IV SOLN
500.0000 [IU] | INTRAVENOUS | Status: AC | PRN
  Administered 2013-02-15: 500 [IU]

## 2013-02-15 MED ORDER — HEPARIN SOD (PORK) LOCK FLUSH 100 UNIT/ML IV SOLN
INTRAVENOUS | Status: AC
  Filled 2013-02-15: qty 5

## 2013-02-15 MED ORDER — SODIUM CHLORIDE 0.9 % IJ SOLN
10.0000 mL | INTRAMUSCULAR | Status: AC | PRN
  Administered 2013-02-15: 10 mL

## 2013-02-15 NOTE — Progress Notes (Signed)
portacath flushed with saline and heparin flush without difficulty. Tolerated well.

## 2013-03-29 ENCOUNTER — Inpatient Hospital Stay (HOSPITAL_COMMUNITY): Admission: RE | Admit: 2013-03-29 | Payer: 59 | Source: Ambulatory Visit

## 2013-05-02 ENCOUNTER — Emergency Department (HOSPITAL_COMMUNITY): Payer: Worker's Compensation

## 2013-05-02 ENCOUNTER — Emergency Department (HOSPITAL_COMMUNITY)
Admission: EM | Admit: 2013-05-02 | Discharge: 2013-05-02 | Disposition: A | Discharge status: Home / Self Care | Payer: Worker's Compensation | Attending: Emergency Medicine | Admitting: Emergency Medicine

## 2013-05-02 ENCOUNTER — Encounter (HOSPITAL_COMMUNITY)
Admission: RE | Admit: 2013-05-02 | Discharge: 2013-05-02 | Disposition: A | Discharge status: Home / Self Care | Payer: 59 | Source: Ambulatory Visit | Attending: Hematology and Oncology | Admitting: Hematology and Oncology

## 2013-05-02 ENCOUNTER — Encounter (HOSPITAL_COMMUNITY): Payer: Self-pay | Admitting: Emergency Medicine

## 2013-05-02 DIAGNOSIS — S8990XA Unspecified injury of unspecified lower leg, initial encounter: Secondary | ICD-10-CM | POA: Insufficient documentation

## 2013-05-02 DIAGNOSIS — M25561 Pain in right knee: Secondary | ICD-10-CM

## 2013-05-02 DIAGNOSIS — W208XXA Other cause of strike by thrown, projected or falling object, initial encounter: Secondary | ICD-10-CM | POA: Insufficient documentation

## 2013-05-02 DIAGNOSIS — Y929 Unspecified place or not applicable: Secondary | ICD-10-CM | POA: Insufficient documentation

## 2013-05-02 DIAGNOSIS — Z87898 Personal history of other specified conditions: Secondary | ICD-10-CM | POA: Insufficient documentation

## 2013-05-02 DIAGNOSIS — M25562 Pain in left knee: Secondary | ICD-10-CM

## 2013-05-02 DIAGNOSIS — S99919A Unspecified injury of unspecified ankle, initial encounter: Principal | ICD-10-CM

## 2013-05-02 DIAGNOSIS — Z791 Long term (current) use of non-steroidal anti-inflammatories (NSAID): Secondary | ICD-10-CM | POA: Insufficient documentation

## 2013-05-02 DIAGNOSIS — Z792 Long term (current) use of antibiotics: Secondary | ICD-10-CM | POA: Insufficient documentation

## 2013-05-02 DIAGNOSIS — K219 Gastro-esophageal reflux disease without esophagitis: Secondary | ICD-10-CM | POA: Insufficient documentation

## 2013-05-02 DIAGNOSIS — Z8546 Personal history of malignant neoplasm of prostate: Secondary | ICD-10-CM | POA: Insufficient documentation

## 2013-05-02 DIAGNOSIS — S99929A Unspecified injury of unspecified foot, initial encounter: Principal | ICD-10-CM

## 2013-05-02 DIAGNOSIS — Y939 Activity, unspecified: Secondary | ICD-10-CM | POA: Insufficient documentation

## 2013-05-02 DIAGNOSIS — Z79899 Other long term (current) drug therapy: Secondary | ICD-10-CM | POA: Insufficient documentation

## 2013-05-02 DIAGNOSIS — Z452 Encounter for adjustment and management of vascular access device: Secondary | ICD-10-CM | POA: Insufficient documentation

## 2013-05-02 MED ORDER — OXYCODONE-ACETAMINOPHEN 5-325 MG PO TABS: 1.0000 | ORAL_TABLET | ORAL | Status: DC | PRN

## 2013-05-02 MED ORDER — IBUPROFEN 800 MG PO TABS: 800.0000 mg | ORAL_TABLET | Freq: Three times a day (TID) | ORAL | Status: DC

## 2013-05-02 MED ORDER — OXYCODONE-ACETAMINOPHEN 5-325 MG PO TABS
1.0000 | ORAL_TABLET | Freq: Once | ORAL | Status: AC
  Administered 2013-05-02: 1 via ORAL
  Filled 2013-05-02: qty 1

## 2013-05-02 MED ORDER — HEPARIN SOD (PORK) LOCK FLUSH 100 UNIT/ML IV SOLN
INTRAVENOUS | Status: AC
  Filled 2013-05-02: qty 5

## 2013-05-02 MED ORDER — HEPARIN SOD (PORK) LOCK FLUSH 100 UNIT/ML IV SOLN
500.0000 [IU] | Freq: Once | INTRAVENOUS | Status: AC
  Administered 2013-05-02: 500 [IU] via INTRAVENOUS

## 2013-05-02 MED ORDER — SODIUM CHLORIDE 0.9 % IJ SOLN
10.0000 mL | Freq: Once | INTRAMUSCULAR | Status: AC
  Administered 2013-05-02: 10 mL via INTRAVENOUS

## 2013-05-02 NOTE — ED Notes (Signed)
Pt c/o bilateral leg pain that began earlier today. Pt states a hog fell onto his legs. Pt was ambulatory after the incident but states pain has increased.

## 2013-05-02 NOTE — ED Provider Notes (Signed)
CSN: [id number]     Arrival date & time 05/02/13  1730 History   First MD Initiated Contact with Patient 05/02/13 1754     Chief Complaint  Patient presents with  . Leg Pain     (Consider location/radiation/quality/duration/timing/severity/associated sxs/prior Treatment) Patient is a 58 y.o. male presenting with knee pain. The history is provided by the patient.  Knee Pain Location:  Knee Time since incident:  4 hours Injury: yes   Mechanism of injury comment:  Pt reports twisting injury to right knee and pain to left knee after a 600 pound hog fell against him pinning him against a wall.   Knee location:  L knee and R knee Chronicity:  New Dislocation: no   Foreign body present:  No foreign bodies Prior injury to area:  No Relieved by:  Rest Worsened by:  Activity and bearing weight Ineffective treatments:  None tried Associated symptoms: swelling   Associated symptoms: no back pain, no decreased ROM, no fever, no muscle weakness, no neck pain, no numbness, no stiffness and no tingling     Past Medical History  Diagnosis Date  . Cancer   . Prostate cancer   . GERD (gastroesophageal reflux disease)   . 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 No pertinent past surgical history. History reviewed. No pertinent family history. History  Substance Use Topics  . Smoking status: Never Smoker   . Smokeless tobacco: Not on file  . Alcohol Use: No    Review of Systems  Constitutional: Negative for fever and chills.  Gastrointestinal: Negative for nausea, vomiting and abdominal pain.  Genitourinary: Negative for dysuria and difficulty urinating.  Musculoskeletal: Positive for arthralgias and joint swelling. Negative for back pain, neck pain and stiffness.       Swelling pain to right knee and pain only to left knee  Skin: Negative for color change and wound.  All other systems reviewed and are negative.     Allergies  Review of patient's allergies  indicates no known allergies.  Home Medications   Prior to Admission medications   Medication Sig Start Date End Date Taking? Authorizing Provider  amoxicillin (AMOXIL) 500 MG capsule Take 500 mg by mouth 2 (two) times daily.    Historical Provider, MD  clarithromycin (BIAXIN) 500 MG tablet Take 500 mg by mouth 2 (two) times daily.    Historical Provider, MD  esomeprazole (NEXIUM) 40 MG capsule Take 40 mg by mouth daily before breakfast.    Historical Provider, MD  nabumetone (RELAFEN) 750 MG tablet Take 750 mg by mouth daily.    Historical Provider, MD  Tamsulosin HCl (FLOMAX) 0.4 MG CAPS Take by mouth.    Historical Provider, MD   BP 132/93  Pulse 79  Temp(Src) 98.6 F (37 C) (Oral)  Resp 20  SpO2 99% Physical Exam  Nursing note and vitals reviewed. Constitutional: He is oriented to person, place, and time. He appears well-developed and well-nourished. No distress.  Cardiovascular: Normal rate, regular rhythm, normal heart sounds and intact distal pulses.   No murmur heard. Pulmonary/Chest: Effort normal and breath sounds normal. No respiratory distress.  Musculoskeletal: He exhibits tenderness.  ttp of the bilateral knees.  Mild effusion to right knee.  No erythema, or step-off deformity.  Pt has full ROM bilaterally with pain reproduced with extension on right. DP pulse brisk, distal sensation intact. Proximal and distal compartments are soft.    Neurological: He is alert and oriented to person, place, and time.  He exhibits normal muscle tone. Coordination normal.  Skin: Skin is warm and dry. No erythema.    ED Course  Procedures (including critical care time) Labs Review Labs Reviewed - No data to display  Imaging Review Dg Knee Complete 4 Views Left  05/02/2013   CLINICAL DATA:  Injury  EXAM: LEFT KNEE - COMPLETE 4+ VIEW  COMPARISON:  None.  FINDINGS: Moderate degenerative change.  No acute fracture.  No dislocation.  IMPRESSION: No acute bony pathology.   Electronically  Signed   By: Maryclare Bean M.D.   On: 05/02/2013 18:28   Dg Knee Complete 4 Views Right  05/02/2013   CLINICAL DATA:  Injury  EXAM: RIGHT KNEE - COMPLETE 4+ VIEW  COMPARISON:  DG KNEE1- 2 VIEWS*R* dated 10/15/2009  FINDINGS: Moderate degenerative change. No acute fracture. No dislocation. Small joint effusion.  IMPRESSION: No acute bony pathology. Small joint effusion. Degenerative change.   Electronically Signed   By: Maryclare Bean M.D.   On: 05/02/2013 18:29     EKG Interpretation None      MDM   Final diagnoses:  Bilateral knee pain    Pt is NV intact.  Mild edema is localized to the right knee joint. No concerning sx's for compartment syndrome.    Pt also seen by Dr. Wyvonnia Dusky.  Care plan discussed.  Given mechanism of injury and exam findings are concerning for internal derangement, patient is scheduled to return here tomorrow 05/03/2013 at 4 PM for MRI of the right knee. Patient agrees to then arrange followup with Dr. Aline Brochure  Knee immobilizer applied on right and ACE wrap to left knee.  crutches given, pain is improved. Remains neurovascularly intact. Proximal and distal compartments are soft  Pain improved after ice and percocet.  Rx for percocet and ibuprofen.     Deandre Stansel L. Vanessa [place], PA-C 05/03/13 2333

## 2013-05-02 NOTE — Progress Notes (Signed)
Porta cath right chest flushed using sterile tech. Good blood return. Flushed with heparin. Tolerated well.

## 2013-05-02 NOTE — Progress Notes (Signed)
Here for porta cath flush.

## 2013-05-02 NOTE — ED Notes (Signed)
Knee immobilizer and ACE wrap placed. Pt received discharged instructions interpreted by his daughter and provided return demonstration of crutches use. Pt received discharge instructions and prescriptions, verbalized understanding and has no further questions. Pt ambulated to exit with crutches in stable condition accompanied by daughter.

## 2013-05-03 ENCOUNTER — Ambulatory Visit (HOSPITAL_COMMUNITY)
Admit: 2013-05-03 | Discharge: 2013-05-03 | Disposition: A | Discharge status: Home / Self Care | Payer: Worker's Compensation | Source: Ambulatory Visit | Attending: Family Medicine | Admitting: Family Medicine

## 2013-05-03 DIAGNOSIS — IMO0002 Reserved for concepts with insufficient information to code with codable children: Secondary | ICD-10-CM | POA: Insufficient documentation

## 2013-05-03 DIAGNOSIS — M7120 Synovial cyst of popliteal space [Baker], unspecified knee: Secondary | ICD-10-CM | POA: Insufficient documentation

## 2013-05-03 DIAGNOSIS — M25569 Pain in unspecified knee: Secondary | ICD-10-CM | POA: Insufficient documentation

## 2013-05-03 DIAGNOSIS — S82209A Unspecified fracture of shaft of unspecified tibia, initial encounter for closed fracture: Secondary | ICD-10-CM | POA: Insufficient documentation

## 2013-05-03 DIAGNOSIS — S82409A Unspecified fracture of shaft of unspecified fibula, initial encounter for closed fracture: Principal | ICD-10-CM

## 2013-05-03 DIAGNOSIS — M1710 Unilateral primary osteoarthritis, unspecified knee: Secondary | ICD-10-CM | POA: Insufficient documentation

## 2013-05-03 DIAGNOSIS — X58XXXA Exposure to other specified factors, initial encounter: Secondary | ICD-10-CM | POA: Insufficient documentation

## 2013-05-04 ENCOUNTER — Emergency Department (HOSPITAL_COMMUNITY): Payer: Worker's Compensation

## 2013-05-04 ENCOUNTER — Emergency Department (HOSPITAL_COMMUNITY)
Admission: EM | Admit: 2013-05-04 | Discharge: 2013-05-04 | Disposition: A | Discharge status: Home / Self Care | Payer: Worker's Compensation | Attending: Emergency Medicine | Admitting: Emergency Medicine

## 2013-05-04 ENCOUNTER — Encounter (HOSPITAL_COMMUNITY): Payer: Self-pay | Admitting: Emergency Medicine

## 2013-05-04 DIAGNOSIS — S82141A Displaced bicondylar fracture of right tibia, initial encounter for closed fracture: Secondary | ICD-10-CM

## 2013-05-04 DIAGNOSIS — Y929 Unspecified place or not applicable: Secondary | ICD-10-CM | POA: Insufficient documentation

## 2013-05-04 DIAGNOSIS — S72413A Displaced unspecified condyle fracture of lower end of unspecified femur, initial encounter for closed fracture: Secondary | ICD-10-CM | POA: Insufficient documentation

## 2013-05-04 DIAGNOSIS — Y9389 Activity, other specified: Secondary | ICD-10-CM | POA: Insufficient documentation

## 2013-05-04 DIAGNOSIS — Z87898 Personal history of other specified conditions: Secondary | ICD-10-CM | POA: Insufficient documentation

## 2013-05-04 DIAGNOSIS — S82109A Unspecified fracture of upper end of unspecified tibia, initial encounter for closed fracture: Secondary | ICD-10-CM | POA: Insufficient documentation

## 2013-05-04 DIAGNOSIS — W64XXXA Exposure to other animate mechanical forces, initial encounter: Secondary | ICD-10-CM | POA: Insufficient documentation

## 2013-05-04 DIAGNOSIS — Z79899 Other long term (current) drug therapy: Secondary | ICD-10-CM | POA: Insufficient documentation

## 2013-05-04 DIAGNOSIS — Z8546 Personal history of malignant neoplasm of prostate: Secondary | ICD-10-CM | POA: Insufficient documentation

## 2013-05-04 DIAGNOSIS — K219 Gastro-esophageal reflux disease without esophagitis: Secondary | ICD-10-CM | POA: Insufficient documentation

## 2013-05-04 DIAGNOSIS — S72412A Displaced unspecified condyle fracture of lower end of left femur, initial encounter for closed fracture: Secondary | ICD-10-CM

## 2013-05-04 MED ORDER — OXYCODONE-ACETAMINOPHEN 7.5-325 MG PO TABS: 1.0000 | ORAL_TABLET | ORAL | Status: DC | PRN

## 2013-05-04 NOTE — ED Notes (Signed)
Pt took pain med before arrival states pain 8/10, knee immobilizer in place

## 2013-05-04 NOTE — ED Notes (Signed)
600 lb hog fell on pt wed, Had MRI yesterday, pt states someone called his daughter and told them to come to ED today

## 2013-05-04 NOTE — ED Provider Notes (Signed)
CSN: [id number]     Arrival date & time 05/04/13  1601 History   First MD Initiated Contact with Patient 05/04/13 1002     Chief Complaint  Patient presents with  . Knee Injury    post MRI results  . Follow-up     (Consider location/radiation/quality/duration/timing/severity/associated sxs/prior Treatment) HPI Comments: Pt seen here 2 days ago after being pinned against a wall by a 600 pound pig.   Pt had an MRi of his right knee today due to swelling and pain.   Pt reports left knee is also hurting,   After having an MRi he was advised to return here for recheck by Dr. Wyvonnia Dusky.  Pt concerned that he could have a fracture in in knee as well.   Pt complains of difficulty walking.   Pt has a history of lymphoma.  Pt finished chemo 6/14.  Pt is the manager of a hog farm.  Pt reports some relief with pain medications  Patient is a 58 y.o. male presenting with knee pain. The history is provided by the patient. No language interpreter was used.  Knee Pain   Past Medical History  Diagnosis Date  . Cancer   . Prostate cancer   . GERD (gastroesophageal reflux disease)   . 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 No pertinent past surgical history. History reviewed. No pertinent family history. History  Substance Use Topics  . Smoking status: Never Smoker   . Smokeless tobacco: Not on file  . Alcohol Use: No    Review of Systems  All other systems reviewed and are negative.     Allergies  Review of patient's allergies indicates no known allergies.  Home Medications   Prior to Admission medications   Medication Sig Start Date End Date Taking? Authorizing Provider  amoxicillin (AMOXIL) 500 MG capsule Take 500 mg by mouth 2 (two) times daily.   Yes Historical Provider, MD  clarithromycin (BIAXIN) 500 MG tablet Take 500 mg by mouth 2 (two) times daily.   Yes Historical Provider, MD  esomeprazole (NEXIUM) 40 MG capsule Take 40 mg by mouth daily before breakfast.    Yes Historical Provider, MD  ibuprofen (ADVIL,MOTRIN) 800 MG tablet Take 1 tablet (800 mg total) by mouth 3 (three) times daily. 05/02/13  Yes Tammy L. Triplett, PA-C  nabumetone (RELAFEN) 750 MG tablet Take 750 mg by mouth daily.   Yes Historical Provider, MD  oxyCODONE-acetaminophen (PERCOCET/ROXICET) 5-325 MG per tablet Take 1 tablet by mouth every 4 (four) hours as needed for severe pain. 05/02/13  Yes Tammy L. Triplett, PA-C  Tamsulosin HCl (FLOMAX) 0.4 MG CAPS Take 0.4 mg by mouth daily.    Yes Historical Provider, MD   BP 121/66  Pulse 60  Temp(Src) 98.4 F (36.9 C) (Oral)  Resp 14  Wt 190 lb (86.183 kg)  SpO2 98% Physical Exam  Nursing note and vitals reviewed. Constitutional: He appears well-developed and well-nourished.  HENT:  Head: Normocephalic and atraumatic.  Eyes: Pupils are equal, round, and reactive to light.  Cardiovascular: Normal rate.   Pulmonary/Chest: Effort normal.  Musculoskeletal: He exhibits tenderness.  Effusion right knee,  No sign of compartment syndrome.  Good pulses,   Tender left knee to palpation,  Slight effusion,  No compartment syndrome, good pulse.   Neurological: He is alert.  Skin: Skin is warm.  Psychiatric: He has a normal mood and affect.    ED Course  Procedures (including critical care time) Labs Review Labs  Reviewed - No data to display  Imaging Review Mr Knee Right Wo Contrast  05/03/2013   CLINICAL DATA:  Right knee injury 1 day ago with right knee pain.  EXAM: MRI OF THE RIGHT KNEE WITHOUT CONTRAST  TECHNIQUE: Multiplanar, multisequence MR imaging of the knee was performed. No intravenous contrast was administered.  COMPARISON:  Plain films right knee 05/02/2013.  FINDINGS: MENISCI  Medial meniscus: There is fraying along the free edge of the body of the medial meniscus with a small appearing horizontal tear in the mid and anterior aspect of the body.  Lateral meniscus:  Intact.  LIGAMENTS  Cruciates:  Intact.  Collaterals:  Intact.   CARTILAGE  Patellofemoral: There is a small fissure in hyaline cartilage at the patellar apex.  Medial: There is extensive hyaline cartilage loss about the medial compartment, worst along the weight-bearing surface of the medial femoral condyle.  Lateral:  Mildly degenerated.  Joint:  Small to moderate joint effusion is identified.  Popliteal Fossa:  Small Baker's cyst is seen.  Extensor Mechanism:  Intact.  Bones: There are subchondral fractures of the anterior aspect of the lateral tibial plateau and lateral femoral condyle with associated marrow edema. There is minimal impaction of the anterior aspect of the weight-bearing surface lateral femoral condyle but overlying hyaline cartilage appears intact. No step-off at the articular surface of the lateral tibial plateau is identified. Prominent tricompartmental osteophytes are present about the knee.  IMPRESSION: Subchondral fractures in the anterior aspect of the lateral tibial plateau and lateral femoral condyle with associated marrow edema.  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  Fraying along the free edge of the body of the medial meniscus with a small horizontal tear is seen in the anterior aspect of the body.  Small Baker's cyst.   Electronically Signed   By: Inge Rise M.D.   On: 05/03/2013 16:43   Mr Knee Left  Wo Contrast  05/04/2013   CLINICAL DATA:  Crushing injury of the knee. Knee pain. History of lymphoma and prostate cancer.  EXAM: MRI OF THE LEFT KNEE WITHOUT CONTRAST  TECHNIQUE: Multiplanar, multisequence MR imaging of the knee was performed. No intravenous contrast was administered.  COMPARISON:  05/02/2013 radiographs  FINDINGS: MENISCI  Medial meniscus: Radial tear of the posterior horn medial meniscus along the meniscal root. Free edge tear of the posterior horn is also visible causing free edge laxity on image 11 of series 6.  Lateral meniscus:  Intact  LIGAMENTS  Cruciates: Abnormal expansion and  increased signal in the ACL the favoring partial tear or ACL cyst. PCL intact.  Collaterals:  Intact  CARTILAGE  Patellofemoral: Chondral fissuring and chondral irregularity along the posterior patellar ridge, images 9-11 of series 8. Moderate femoral trochlear groove chondral thinning with mild chondral heterogeneity.  Medial: Prominent chondral thinning with mild chondral surface irregularity.  Lateral:  Mild chondral thinning and mild chondral heterogeneity.  Joint:  Trace knee effusion.  Popliteal Fossa:  Intact  Extensor Mechanism:  Intact  Bones: Abnormal edema in the lateral femoral condyle marginally, with a small band of low subcortical T1 signal on image 13 of series 4 suggesting a small subcortical stress fracture. Subtle marrow edema posteromedially in the medial femoral condyle compatible with bone bruising.  IMPRESSION: 1. Radial and free edge tearing of the posterior horn medial meniscus. 2. Abnormal expansion and edema of the ACL. Distal ACL fibers have an abnormal slope. Appearance favors a high-grade partial tear of the ACL or ACL  cyst over a complete ACL tear. 3. Chondral thinning is most severe in the medial compartment, with chondral fissuring and irregularity tricompartmentally. 4. Trace knee effusion. 5. Small subcortical stress fracture in the lateral femoral condyle. 6. Mild bone bruising post for medially in the medial femoral condyle.   Electronically Signed   By: Sherryl Barters M.D.   On: 05/04/2013 11:37   Dg Knee Complete 4 Views Left  05/02/2013   CLINICAL DATA:  Injury  EXAM: LEFT KNEE - COMPLETE 4+ VIEW  COMPARISON:  None.  FINDINGS: Moderate degenerative change.  No acute fracture.  No dislocation.  IMPRESSION: No acute bony pathology.   Electronically Signed   By: Maryclare Bean M.D.   On: 05/02/2013 18:28   Dg Knee Complete 4 Views Right  05/02/2013   CLINICAL DATA:  Injury  EXAM: RIGHT KNEE - COMPLETE 4+ VIEW  COMPARISON:  DG KNEE1- 2 VIEWS*R* dated 10/15/2009  FINDINGS:  Moderate degenerative change. No acute fracture. No dislocation. Small joint effusion.  IMPRESSION: No acute bony pathology. Small joint effusion. Degenerative change.   Electronically Signed   By: Maryclare Bean M.D.   On: 05/02/2013 18:29     EKG Interpretation None      MDM I spoke with Dr. Luna Glasgow who advised knee immbolizer on right and walker rather than  crutches.      Final diagnoses:  Tibial plateau fracture, right  Fracture of femoral condyle, left, closed    MRI's reviewed   Shows r anterior tibial plateau fx with medial meniscus tear. Left subcondylar fx lateral femoral condyle and medial meniscus tear.       [place], PA-C 05/04/13 1247

## 2013-05-04 NOTE — ED Provider Notes (Addendum)
Medical screening examination/treatment/procedure(s) were conducted as a shared visit with non-physician practitioner(s) and myself.  I personally evaluated the patient during the encounter.  Bilateral knee pain after being pinned against fence by hog. R knee effusion, flexion and extension intact. +2 DP and Pt Pulses.  Femoral and tibial compartments soft. FROM L knee and R knee. Xrays negative Risk of compartment syndrome d/w patient and caregivers. Advised to return for worsening pain, paresthesias, color or temperature change, weakness, or any other concerns.  Addendum: outpatient MRI ordered by [facility] Triplett shows tibial plateau and femoral condyle fractures with medial menicus tear.  D/w Patient's daughter to have patient return to ED for recheck and ensure no compartment syndrome. Will need L knee rechecked as well.  Consider CT/MRI left knee.   EKG Interpretation None       Ezequiel Essex, MD 05/04/13 [place], MD 05/04/13 [phone]

## 2013-05-04 NOTE — Discharge Instructions (Signed)
Fractura de la rtula (en el adulto) (Knee Fracture, Adult) Una fractura de rodilla es una quebradura en cualquiera de los huesos de la parte inferior del hueso del muslo, la parte superior de los huesos de la pierna o en la rtula. Cuando los huesos no estn ubicados de la forma en que deben estar, se denomina dislocacin. En algunos casos puede haber dislocacin junto con la fractura. [place] sntomas son dolor, hinchazn, incapacidad para doblar la rodilla, deformidad de la rodilla e imposibilidad de caminar.  DIAGNSTICO Una fractura se diagnostica fcilmente con radiografas. En algunos casos se indican estudios especiales si se sospecha una fractura pero no se confirma en las radiografas comunes. Si los vasos sanguneos que rodean la rodilla han sido daados, se realizarn estudios especiales para observar el tipo de lesin. TRATAMIENTO  En los primeros das se coloca una tablilla para permitir que baje la hinchazn. Luego que la pierna se deshinche, se colocar un yeso. En algunos casos se coloca un yeso con los lados abiertos para permitir que la rodilla se hinche. Si los Teaching laboratory technician, no se requiere nada ms.  Si los huesos estn fuera de Environmental consultant, Designer, fashion/clothing para poder Product/process development scientist. Si el desplazamiento es [employer], ser necesario realizar una ciruga para Glass blower/designer las piezas en su lugar, en algunos casos colocando clavos o paquetas metlicas.  Estas fracturas generalmente curan en un perodo de cuatro a seis semanas. INSTRUCCIONES PARA EL CUIDADO DOMICILIARIO  Utilice las muletas del modo en que se le ha indicado.  Para disminuir la hinchazn, mantenga elevada la pierna lesionada mientras est sentado o acostado.  Aplique hielo sobre la lesin durante 15 a 20 minutos 3 a 4 veces por [employer] se encuentre despierto, durante 2 das. Coloque el hielo en una bolsa plstica y ponga una toalla delgada entre la bolsa y [place].  Si  le colocaron un yeso o un molde de [place] de vidrio:  No trate de rascarse la piel por debajo del molde utilizando objetos filosos o puntiagudos.  [place] piel de alrededor del yeso. Puede colocarse una locin en las zonas rojas o doloridas.  Mantenga el yeso seco y limpio.  Si tiene una tablilla de yeso:  sela del modo en que se lo indicaron.  Puede aflojar el elstico que rodea la tablilla si los dedos se entumecen, siente hormigueos, se enfran o se vuelven de color azul.  No ejerza presin en ninguna parte del yeso o tablilla; podra romperse. Durante las primeras 24 horas mantenga el yeso sobre una almohada hasta que est completamente duro.  Puede proteger el yeso o la tablilla durante el bao con una bolsa plstica. No los sumerja en el agua.  Slo tome medicamentos de venta libre o prescriptos para Glass blower/designer, las [place], o bajar la fiebre segn las indicaciones de su mdico.  Consulte a su mdico si el yeso se daa o se rompe.  Es muy importante que cumpla con las visitas para el control. Si no cumple con las indicaciones podr empeorar el problema o fracasar la curacin de la fractura. SOLICITE ATENCIN MDICA INMEDIATAMENTE SI:  Tiene dolor intenso y continuo.  La rodilla st ms hinchada que antes de colocarle el yeso.  Siente dolor en la zona inferior a la fractura.  [place] uas de los pies que se encuentren por debajo de la lesin se vuelven azules o grises, o siente fro o entumecimiento.  Observa una secrecin por debajo del yeso.  Presenta sntomas nuevos sin motivo aparente (las drogas utilizadas durante el tratamiento pueden producir [place]). ASEGRESE QUE:   Comprende estas instrucciones.  Controlar su enfermedad.  Solicitar ayuda de inmediato si no mejora o si empeora. Document Released: 04/15/2008 Document Revised: 03/22/2011 [facility] Patient Information 2014 [place], [place].  Fractura del fmur (Femur  Fracture) [place] de fmur es la ruptura completa o incompleta del hueso del muslo (fmur). Es una lesin grave, aunque poco frecuente en la actividad deportiva. Generalmente antes que el fmur se lesiona el tobillo, la zona baja de la pierna o la rodilla.  SNTOMAS  Dolor intenso en el muslo en el momento de la lesin.  Sensibilidad e inflamacin en el muslo.  Hemorragia y hematomas en el muslo.  Imposibilidad de soportar un peso en la extremidad lesionada.  Deformidad visible si la fractura es completa y los fragmentos de hueso se separan lo suficiente como para distorsionar el contorno normal de la pierna.  Sensacin de adormecimiento y fro en la pierna y el pie ms all del lugar de la fractura, si la circulacin de sangre est interrumpida. CAUSAS  Una fractura se produce cuando una fuerza aplicada a un hueso es mayor que la que el hueso puede soportar. Las fracturas en el fmur generalmente son consecuencia de un golpe directo (traumatismo).  Tensin indirecta causada por torcedura o contraccin muscular violenta. LOS RIESGOS AUMENTAN CON  Los deportes de contacto (ftbol, hockey, ftbol Scientist, forensic), deportes de motor y Passenger transport manager.  Anormalidad sea previa o actual (osteoporosis o tumores).  Trastornos metablicos o problemas hormonales.  Dficits o trastornos nutricionales (anorexia o bulimia).  Poca fuerza y flexibilidad. PREVENCIN  Precalentamiento adecuado y elongacin antes de la [employer].  Mantener la forma fsica:  Fuerza muscular.  Rendimiento y flexibilidad.  Capacidad cardiovascular.  Use equipo protector adecuado, como musleras para la prctica de ftbol o hockey. PRONSTICO Esta afeccin generalmente podr curarse con el tratamiento adecuado, aunque puede demorar entre 6 y 8 semanas para curarse.  COMPLICACIONES RELACIONADAS  Shock debido a un bajo volmen sanguneo (hipovolmico) por prdida de sangre en el muslo.  No se cura adecuadamente  (ausencia de unin).  Curacin en posicin incorrecta (mala unin).  Aumento de la presin dentro de la pierna (sndrome compartimental), debido a que la lesin interrumpe el flujo de sangre a la pierna y pie, y causa lesiones en los nervios y los msculos.  Acortamiento de los [place].  Aumento de la probabilidad de lesiones repetidas en la pierna.  Rigidez en la cadera o en la rodilla.  Detencin del desarrollo normal del hueso en los nios.  Riesgos de la ciruga: infecciones hemorragias, lesiones en los nervios (adormecimiento, debilidad, parlisis), y necesidad de someterse a [place].  Infeccin en las fracturas expuestas (la piel est abierta en el lugar de la fractura).  Formacin de hueso dentro del msculo (miositis osificante).  Tiempo de curacin prolongado si la actividad se reanuda [employer] TRATAMIENTO El tratamiento inicial incluye el uso de medicamentos y la aplicacin de hielo para reducir Conservation officer, historic buildings y la inflamacin. El tratamiento de estas fracturas generalmente requiere ciruga para permitir la curacin del hueso en la correcta alineacin y para reducir el riesgo de posibles complicaciones. Generalmente la ciruga consiste en la colocacin de una varilla de metal en el centro del hueso o en la fijacin de placas y tornillos en la lnea de la fractura. No es frecuente la indicacin de uso  de un yeso, debido a que un yeso debera extenderse al estmago, la zona inferior de la espalda, la pelvis y al pie. En los adultos no se aconseja la traccin (aplicar presin utilizando un dispositivo), debido a la necesidad de reposo prolongado en la cama (6 a 8 semanas). En ciertos casos, se indican estimuladores del desarrollo seo. Luego que el hueso se cura (con o sin [place]), es necesario realizar ejercicios de elongacin y fortalecimiento. Los ejercicios pueden Press photographer o con un terapeuta. La varilla, placa o tornillos colocados en la ciruga  slo se retiran si causan molestias.  MEDICAMENTOS  Si es necesaria la administracin de medicamentos para Conservation officer, historic buildings, se recomiendan los antiinflamatorios no esteroides, como aspirina e ibuprofeno y otros calmantes menores, como acetaminofeno.  No tome medicamentos para el dolor dentro de los 7 das previos a la [place].  El profesional podr prescribirle calmantes ms fuertes. Utilcelos como se le indique y slo cuando lo necesite. SOLICITE ATENCIN MDICA SI:  Los sntomas empeoran o no mejoran en 2 semanas, a pesar de Chiropodist.  Luego de la ciruga o la inmovilizacin sucede alguna de las siguientes situaciones: (Informe inmediatamente si observa alguno de estos signos):  Hinchazn por encima o por debajo del lugar de la fractura.  Dolor intenso y persistente.  Piel seca o azul por debajo del sitio de la fractura, especialmente en las uas. Adormecimiento o prdida de la sensibilidad por debajo del sitio de Electrical engineer.  Desarrolla nuevos e inexplicables sntomas. (Los medicamentos indicados en el tratamiento le ocasionan efectos secundarios). Document Released: 10/14/2005 Document Revised: 03/22/2011 [facility] Patient Information 2014 [place], [place].

## 2013-05-04 NOTE — ED Notes (Signed)
Patient with no complaints at this time. Respirations even and unlabored. Skin warm/dry. Discharge instructions reviewed with patient at this time. Patient given opportunity to voice concerns/ask questions. Patient discharged at this time and left Emergency Department with steady gait.   

## 2013-05-06 NOTE — ED Provider Notes (Signed)
Medical screening examination/treatment/procedure(s) were performed by non-physician practitioner and as supervising physician I was immediately available for consultation/collaboration.   EKG Interpretation None        Alfonzo Feller, DO 05/06/13 1252

## 2013-05-08 ENCOUNTER — Encounter: Payer: Self-pay | Admitting: Orthopedic Surgery

## 2013-05-08 ENCOUNTER — Ambulatory Visit (INDEPENDENT_AMBULATORY_CARE_PROVIDER_SITE_OTHER): Payer: 59 | Admitting: Orthopedic Surgery

## 2013-05-08 VITALS — BP 140/91 | Ht 66.0 in | Wt 191.0 lb

## 2013-05-08 DIAGNOSIS — M23329 Other meniscus derangements, posterior horn of medial meniscus, unspecified knee: Secondary | ICD-10-CM

## 2013-05-08 DIAGNOSIS — S99929A Unspecified injury of unspecified foot, initial encounter: Secondary | ICD-10-CM

## 2013-05-08 DIAGNOSIS — S99919A Unspecified injury of unspecified ankle, initial encounter: Secondary | ICD-10-CM

## 2013-05-08 DIAGNOSIS — S8990XA Unspecified injury of unspecified lower leg, initial encounter: Secondary | ICD-10-CM

## 2013-05-08 MED ORDER — HYDROCODONE-ACETAMINOPHEN 7.5-325 MG PO TABS: 1.0000 | ORAL_TABLET | ORAL | Status: DC | PRN

## 2013-05-08 MED ORDER — IBUPROFEN 800 MG PO TABS
800.0000 mg | ORAL_TABLET | Freq: Three times a day (TID) | ORAL | Status: DC | PRN
Start: 2013-05-08 — End: 2013-05-11

## 2013-05-08 NOTE — Progress Notes (Signed)
Patient ID: Brandon Norton, male   DOB: 17-Apr-1955, 58 y.o.   MRN: [id number]  NEW  ER FOLLOW UP  Chief Complaint  Patient presents with  . 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 August greater on their way in or out of the pin and a pinned him up against the rails and twisted his knees he complains of bilateral knee pain right worse than left. He's been at the emergency room already. His injury date was April 22. He is complaining of pain which is throbbing occurs more often at night it is 9/10 he's had an x-ray and an MRI of both knees he has injuries which I will detail later. He is on ibuprofen and Percocet which helps his pain. He got a brace on his right knee. Left knee is wrapped in an Ace wrap. He has no allergies.  Family history diabetes cancer  His mother is alive at 30 his father is deceased age unknown his siblings are alive. He denies any past medical history other than noted cancer he had chemotherapy for a believe prostate cancer. He is on Flomax ibuprofen and oxycodone. His review of systems is negative except that he wears glasses.  VS BP 140/91  Ht 5\' 6"  (1.676 m)  Wt 191 lb (86.637 kg)  BMI 30.84 kg/m2 General and hygiene are normal. Development and nutrition are normal. Body habitus normal Mood Affect are normal The patient is alert and oriented x3 Ambulatory status walker brace right knee wrap left knee  RUE (include skin) Inspection reveals no tenderness or swelling,  range of motion is normal. The joints are located without subluxation or laxity. Motor exam reveals grade 5 strength and the skin is without rash lesion or ulcer  LUE Inspection reveals no tenderness or swelling,  range of motion is normal. The joints are located without subluxation or laxity. Motor exam reveals grade 5 strength;  skin is without rash lesion or ulcer  RLE Right knee findings. Medial joint line  tenderness positive McMurray's grade 1 laxity anterior cruciate ligament firm endpoint grade 1 MCL laxity with pain to valgus stress normal and point knee flexion limited by pain and swelling. Muscle tone normal. Strength normal.  LLE Left knee medial joint line tenderness 120 of knee flexion negative McMurray's knee feels stable. Muscle tone and strength normal.  CDV peripheral pulses are intact without swelling or varicose veins  LYMPH are normal in all 4 extremities with no palpable nodes  DTR are equal and symmetric Balance  is normal  MRI scan left knee radial and free edge tear posterior horn medial meniscus abnormal expansion of anterior cruciate ligament distal anterior cruciate ligament fibers abnormal slope possibility of high-grade partial tear or cyst. I believe this to be chronic changes based on the knee exam. Trace effusion 3 compartment arthritis subcortical stress fracture lateral femoral condyle bracing posterior medial femoral condyle which may or may not suggest acute anterior cruciate ligament injury.  Right knee subchondral fractures anterior aspect lateral tibial plateau lateral femoral condyle with marrow edema 3 compartment arthritis fraying along the free edge of the body of the medial meniscus with small horizontal tear in the anterior aspect with Baker's cyst.  He is definitely more symptomatic right knee versus left I recommend arthroscopic medial meniscectomy right knee. Expect out of work x6 weeks  Recommend bracing left knee allow fractures to heal  Encounter  Diagnoses  Name Primary?  . Knee injury Yes  . Medial meniscus, posterior horn derangement      Meds ordered this encounter  Medications  . HYDROcodone-acetaminophen (NORCO) 7.5-325 MG per tablet    Sig: Take 1 tablet by mouth every 4 (four) hours as needed for moderate pain.    Dispense:  56 tablet    Refill:  0  . ibuprofen (ADVIL,MOTRIN) 800 MG tablet    Sig: Take 1 tablet (800 mg total) by  mouth every 8 (eight) hours as needed.    Dispense:  90 tablet    Refill:  5    Brace applied in the office left knee  Order brace right knee until then wear knee immobilizer.

## 2013-05-08 NOTE — Patient Instructions (Signed)
Call workers comp to see if they will approve surgery for right knee arthroscopy Continue to use walker Get Brace for right knee Wear brace on left knee

## 2013-05-09 ENCOUNTER — Telehealth: Payer: Self-pay | Admitting: Orthopedic Surgery

## 2013-05-09 NOTE — Telephone Encounter (Signed)
Regarding Date of service 05/08/13 and notes indicating surgery, primary CPT 29881, ICD9 codes 717.2, 959.7, out-patient, [facility].  The injury is work-related, date of injury 05/02/13. Contacted accident insurance provider, [employer], agent Pincus Large, ph# [phone], fax [phone], and was advised that the plan is active, and coverage is $5000.00 maximum on this plan.  The employer is [employer], aka [employer].  He states this is the insurance plan he carries for his workers.  He states he is responsible for any additional balances related to this work-related incident.  I re-checked as to whether any other insurance is available, such as a specific Workers Transport planner, and he states this is [employer], and again stated he is responsible, and said he is willing to come in to pay up front, any amount that is needed "to get this surgery started."  His direct contact ph# is [phone].

## 2013-05-10 ENCOUNTER — Other Ambulatory Visit: Payer: Self-pay | Admitting: *Deleted

## 2013-05-10 NOTE — Telephone Encounter (Signed)
Per Marja Kays conversation with Forestine Na and [place], aware of Mr. 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  Once surgery date is scheduled, pre-service center will contact Mr. Apple and discuss the upfront payment required.  Verbal authorization from Mr. Apple and from [employer] received.  Hospital services will bill under Med-Pay; physician fees - [place], as entered; agent Pincus Large to handle billing process for employer. * Researched [employer] as possible secondary; subscriber of plan is spouse; patient does have coverage under this plan.

## 2013-05-11 ENCOUNTER — Encounter (HOSPITAL_COMMUNITY): Payer: Self-pay | Admitting: Pharmacy Technician

## 2013-05-11 NOTE — Telephone Encounter (Addendum)
(  Cont'd)from 05/10/13 - Contacted [employer], insurance provider under [employer], ph [phone], to start a pre-authorization (as secondary insurer); on 05/11/13, 11:34am, per Sherri G, No review of records needed; No pre-authorization required for out-patient procedures.  REF# [id number].  ** Surgery Date has been scheduled for:  05/18/13, Pre-op appointment scheduled for:  05/15/13 - Patient aware. ** Pre-Service center will follow up as noted.

## 2013-05-15 ENCOUNTER — Encounter (HOSPITAL_COMMUNITY)
Admission: RE | Admit: 2013-05-15 | Discharge: 2013-05-15 | Disposition: A | Discharge status: Home / Self Care | Payer: 59 | Source: Ambulatory Visit | Attending: Orthopedic Surgery | Admitting: Orthopedic Surgery

## 2013-05-15 ENCOUNTER — Encounter (HOSPITAL_COMMUNITY): Payer: Self-pay

## 2013-05-15 DIAGNOSIS — Z01812 Encounter for preprocedural laboratory examination: Secondary | ICD-10-CM | POA: Insufficient documentation

## 2013-05-15 DIAGNOSIS — Z01810 Encounter for preprocedural cardiovascular examination: Secondary | ICD-10-CM | POA: Insufficient documentation

## 2013-05-15 LAB — BASIC METABOLIC PANEL
BUN: 12 mg/dL (ref 6–23)
CO2: 28 mEq/L (ref 19–32)
Calcium: 9.6 mg/dL (ref 8.4–10.5)
Chloride: 101 mEq/L (ref 96–112)
Creatinine, Ser: 0.75 mg/dL (ref 0.50–1.35)
GFR calc Af Amer: >90 mL/min (ref >90)
GLUCOSE: 125 mg/dL — AB (ref 70–99)
Potassium: 4.4 mEq/L (ref 3.7–5.3)
Sodium: 140 mEq/L (ref 137–147)

## 2013-05-15 LAB — CBC
HCT: 36.3 % — ABNORMAL LOW (ref 39.0–52.0)
Hemoglobin: 12.6 g/dL — ABNORMAL LOW (ref 13.0–17.0)
MCH: 31.7 pg (ref 26.0–34.0)
MCHC: 34.7 g/dL (ref 30.0–36.0)
MCV: 91.4 fL (ref 78.0–100.0)
Platelets: 195 10*3/uL (ref 150–400)
RBC: 3.97 MIL/uL — ABNORMAL LOW (ref 4.22–5.81)
RDW: 12.2 % (ref 11.5–15.5)
WBC: 4.6 10*3/uL (ref 4.0–10.5)

## 2013-05-15 NOTE — Patient Instructions (Signed)
Your procedure is scheduled on: 05/18/2013  Report to [facility] at   9:00  AM.  Call this number if you have problems the morning of surgery: [phone]   Remember:   Do not drink or eat food:After Midnight.  :    Do not wear jewelry, make-up or nail polish.  Do not wear lotions, powders, or perfumes. You may wear deodorant.  Do not shave 48 hours prior to surgery. Men may shave face and neck.  Do not bring valuables to the hospital.  Contacts, dentures or bridgework may not be worn into surgery.  Leave suitcase in the car. After surgery it may be brought to your room.  For patients admitted to the hospital, checkout time is 11:00 AM the day of discharge.   Patients discharged the day of surgery will not be allowed to drive home.    Special Instructions: Shower using CHG night before surgery and shower the day of surgery use CHG.  Use special wash - you have one bottle of CHG for all showers.  You should use approximately 1/2 of the bottle for each shower.   Please read over the following fact sheets that you were given: Pain Booklet, MRSA Information, Surgical Site Infection Prevention and Care and Recovery After Surgery   Arthroscopic Procedure, Knee, Care After Refer to this sheet in the next few weeks. These discharge instructions provide you with general information on caring for yourself after you leave the hospital. Your health care provider may also give you specific instructions. Your treatment has been planned according to the most current medical practices available, but unavoidable complications sometimes occur. If you have any problems or questions after discharge, please call your health care provider. HOME CARE INSTRUCTIONS   It is normal to be sore for a couple days after surgery. See your health care provider if this seems to be getting worse rather than better.  Only take over-the-counter or prescription medicines for pain, discomfort, or fever as directed by your  health care provider.  Take showers rather than baths, or as directed by your health care provider.  Change bandages (dressings) if necessary or as directed.  You may resume normal diet and activities as directed or allowed.  Avoid lifting and driving until you are directed otherwise.  Make an appointment to see your health care provider for stitches (suture) or staple removal as directed.  You may put ice on the area.  Put the ice in a plastic bag. Place a towel between your skin and the bag.  Leave the ice on for 15 20 minutes, three to four times per day for the first 2 days.  Elevate the knee above the level of your heart to reduce swelling, and avoid dangling the leg.  Do 10-15 ankle pumps (pointing your toes toward you and then away from you) two to three times daily.  If you are given compression stockings to wear after surgery, use them for as long as your surgeon tells you (around 10-14 days).  Avoid smoking and exposure to second-hand smoke. SEEK MEDICAL CARE IF:   You have increased bleeding from your wounds.  You see redness or swelling or you have increasing pain in your wounds.  You have pus coming from your wound.  You have a fever or persistent symptoms for more than 2 3 days.  You notice a bad smell coming from the wound or dressing.  You have severe pain with any motion of your knee. SEEK IMMEDIATE  MEDICAL CARE IF:   You develop a rash.  You have difficulty breathing.  You develop any reaction or side effects to medicines taken.  You develop pain in the calves or back of the knee.  You develop chest pain, shortness of breath, or difficulty breathing.  You develop numbness or tingling in the leg or foot. MAKE SURE YOU:   Understand these instructions.  Will watch your condition.  Will get help right away if you are not doing well or you get worse. Document Released: 07/17/2004 Document Revised: 08/30/2012 Document Reviewed:  05/25/2012 [facility] Patient Information 2014 [place], [place]. PATIENT INSTRUCTIONS POST-ANESTHESIA  IMMEDIATELY FOLLOWING SURGERY:  Do not drive or operate machinery for the first twenty four hours after surgery.  Do not make any important decisions for twenty four hours after surgery or while taking narcotic pain medications or sedatives.  If you develop intractable nausea and vomiting or a severe headache please notify your doctor immediately.  FOLLOW-UP:  Please make an appointment with your surgeon as instructed. You do not need to follow up with anesthesia unless specifically instructed to do so.  WOUND CARE INSTRUCTIONS (if applicable):  Keep a dry clean dressing on the anesthesia/puncture wound site if there is drainage.  Once the wound has quit draining you may leave it open to air.  Generally you should leave the bandage intact for twenty four hours unless there is drainage.  If the epidural site drains for more than 36-48 hours please call the anesthesia department.  QUESTIONS?:  Please feel free to call your physician or the hospital operator if you have any questions, and they will be happy to assist you.

## 2013-05-17 NOTE — H&P (Signed)
Patient ID: Brandon Norton, male   DOB: 1955-03-04, 58 y.o.   MRN: [id number]  NEW  ER FOLLOW UP    Chief Complaint   Patient presents with   .  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 August greater on their way in or out of the pin and a pinned him up against the rails and twisted his knees he complains of bilateral knee pain right worse than left. He's been at the emergency room already. His injury date was April 22. He is complaining of pain which is throbbing occurs more often at night it is 9/10 he's had an x-ray and an MRI of both knees he has injuries which I will detail later. He is on ibuprofen and Percocet which helps his pain. He got a brace on his right knee. Left knee is wrapped in an Ace wrap. He has no allergies.  Family history diabetes cancer  His mother is alive at 41 his father is deceased age unknown his siblings are alive. He denies any past medical history other than noted cancer he had chemotherapy for a believe prostate cancer. He is on Flomax ibuprofen and oxycodone. His review of systems is negative except that he wears glasses.  VS BP 140/91  Ht 5\' 6"  (1.676 m)  Wt 191 lb (86.637 kg)  BMI 30.84 kg/m2 General and hygiene are normal. Development and nutrition are normal. Body habitus normal Mood Affect are normal The patient is alert and oriented x3 Ambulatory status walker brace right knee wrap left knee  RUE (include skin) Inspection reveals no tenderness or swelling,  range of motion is normal. The joints are located without subluxation or laxity. Motor exam reveals grade 5 strength and the skin is without rash lesion or ulcer  LUE Inspection reveals no tenderness or swelling,  range of motion is normal. The joints are located without subluxation or laxity. Motor exam reveals grade 5 strength;  skin is without rash lesion or ulcer  RLE Right knee findings. Medial joint  line tenderness positive McMurray's grade 1 laxity anterior cruciate ligament firm endpoint grade 1 MCL laxity with pain to valgus stress normal and point knee flexion limited by pain and swelling. Muscle tone normal. Strength normal.  LLE Left knee medial joint line tenderness 120 of knee flexion negative McMurray's knee feels stable. Muscle tone and strength normal.  CDV peripheral pulses are intact without swelling or varicose veins  LYMPH are normal in all 4 extremities with no palpable nodes  DTR are equal and symmetric Balance  is normal  MRI scan left knee radial and free edge tear posterior horn medial meniscus abnormal expansion of anterior cruciate ligament distal anterior cruciate ligament fibers abnormal slope possibility of high-grade partial tear or cyst. I believe this to be chronic changes based on the knee exam. Trace effusion 3 compartment arthritis subcortical stress fracture lateral femoral condyle bracing posterior medial femoral condyle which may or may not suggest acute anterior cruciate ligament injury.  Right knee subchondral fractures anterior aspect lateral tibial plateau lateral femoral condyle with marrow edema 3 compartment arthritis fraying along the free edge of the body of the medial meniscus with small horizontal tear in the anterior aspect with Baker's cyst.  He is definitely more symptomatic right knee versus left I recommend arthroscopic medial meniscectomy right knee. With diagnosis of point medial meniscus,  Expect out of work x6 weeks

## 2013-05-18 ENCOUNTER — Ambulatory Visit (HOSPITAL_COMMUNITY): Payer: Worker's Compensation | Admitting: Anesthesiology

## 2013-05-18 ENCOUNTER — Ambulatory Visit (HOSPITAL_COMMUNITY)
Admission: RE | Admit: 2013-05-18 | Discharge: 2013-05-18 | Disposition: A | Discharge status: Home / Self Care | Payer: Worker's Compensation | Source: Ambulatory Visit | Attending: Orthopedic Surgery | Admitting: Orthopedic Surgery

## 2013-05-18 ENCOUNTER — Encounter (HOSPITAL_COMMUNITY)
Admission: RE | Disposition: A | Discharge status: Home / Self Care | Payer: Self-pay | Source: Ambulatory Visit | Attending: Orthopedic Surgery

## 2013-05-18 ENCOUNTER — Encounter (HOSPITAL_COMMUNITY): Payer: Self-pay | Admitting: *Deleted

## 2013-05-18 ENCOUNTER — Encounter (HOSPITAL_COMMUNITY): Payer: Worker's Compensation | Admitting: Anesthesiology

## 2013-05-18 DIAGNOSIS — X58XXXA Exposure to other specified factors, initial encounter: Secondary | ICD-10-CM | POA: Insufficient documentation

## 2013-05-18 DIAGNOSIS — Y929 Unspecified place or not applicable: Secondary | ICD-10-CM | POA: Diagnosis not present

## 2013-05-18 DIAGNOSIS — IMO0002 Reserved for concepts with insufficient information to code with codable children: Secondary | ICD-10-CM | POA: Insufficient documentation

## 2013-05-18 DIAGNOSIS — M659 Unspecified synovitis and tenosynovitis, unspecified site: Secondary | ICD-10-CM | POA: Insufficient documentation

## 2013-05-18 DIAGNOSIS — M23329 Other meniscus derangements, posterior horn of medial meniscus, unspecified knee: Secondary | ICD-10-CM

## 2013-05-18 DIAGNOSIS — M179 Osteoarthritis of knee, unspecified: Secondary | ICD-10-CM

## 2013-05-18 DIAGNOSIS — M1710 Unilateral primary osteoarthritis, unspecified knee: Secondary | ICD-10-CM

## 2013-05-18 HISTORY — PX: KNEE ARTHROSCOPY WITH MEDIAL MENISECTOMY: SHX5651

## 2013-05-18 SURGERY — ARTHROSCOPY, KNEE, WITH MEDIAL MENISCECTOMY
Anesthesia: General | Laterality: Right

## 2013-05-18 MED ORDER — FENTANYL CITRATE 0.05 MG/ML IJ SOLN
INTRAMUSCULAR | Status: AC
Start: 2013-05-18 — End: 2013-05-18
  Filled 2013-05-18: qty 2

## 2013-05-18 MED ORDER — FENTANYL CITRATE 0.05 MG/ML IJ SOLN
25.0000 ug | INTRAMUSCULAR | Status: AC
  Administered 2013-05-18 (×2): 25 ug via INTRAVENOUS

## 2013-05-18 MED ORDER — BUPIVACAINE-EPINEPHRINE (PF) 0.5% -1:200000 IJ SOLN
INTRAMUSCULAR | Status: DC | PRN
  Administered 2013-05-18: 60 mL

## 2013-05-18 MED ORDER — PROPOFOL 10 MG/ML IV BOLUS
INTRAVENOUS | Status: DC | PRN
  Administered 2013-05-18: 160 mg via INTRAVENOUS
  Administered 2013-05-18: 40 mg via INTRAVENOUS

## 2013-05-18 MED ORDER — FENTANYL CITRATE 0.05 MG/ML IJ SOLN
INTRAMUSCULAR | Status: AC
  Filled 2013-05-18: qty 2

## 2013-05-18 MED ORDER — BUPIVACAINE-EPINEPHRINE (PF) 0.5% -1:200000 IJ SOLN
INTRAMUSCULAR | Status: AC
  Filled 2013-05-18: qty 60

## 2013-05-18 MED ORDER — FENTANYL CITRATE 0.05 MG/ML IJ SOLN
25.0000 ug | INTRAMUSCULAR | Status: DC | PRN
  Administered 2013-05-18: 13:00:00 via INTRAVENOUS
  Administered 2013-05-18: 50 ug via INTRAVENOUS

## 2013-05-18 MED ORDER — ONDANSETRON HCL 4 MG/2ML IJ SOLN
4.0000 mg | Freq: Once | INTRAMUSCULAR | Status: AC | PRN
  Administered 2013-05-18: 4 mg via INTRAVENOUS

## 2013-05-18 MED ORDER — FENTANYL CITRATE 0.05 MG/ML IJ SOLN
INTRAMUSCULAR | Status: DC | PRN
  Administered 2013-05-18: 25 ug via INTRAVENOUS
  Administered 2013-05-18: 50 ug via INTRAVENOUS
  Administered 2013-05-18: 25 ug via INTRAVENOUS

## 2013-05-18 MED ORDER — LIDOCAINE HCL (CARDIAC) 10 MG/ML IV SOLN
INTRAVENOUS | Status: DC | PRN
  Administered 2013-05-18: 10 mg via INTRAVENOUS

## 2013-05-18 MED ORDER — MIDAZOLAM HCL 2 MG/2ML IJ SOLN
1.0000 mg | INTRAMUSCULAR | Status: DC | PRN
  Administered 2013-05-18: 2 mg via INTRAVENOUS

## 2013-05-18 MED ORDER — PROPOFOL 10 MG/ML IV BOLUS
INTRAVENOUS | Status: AC
  Filled 2013-05-18: qty 20

## 2013-05-18 MED ORDER — ONDANSETRON HCL 4 MG/2ML IJ SOLN
INTRAMUSCULAR | Status: AC
  Filled 2013-05-18: qty 2

## 2013-05-18 MED ORDER — LIDOCAINE HCL (PF) 1 % IJ SOLN
INTRAMUSCULAR | Status: AC
  Filled 2013-05-18: qty 5

## 2013-05-18 MED ORDER — GLYCOPYRROLATE 0.2 MG/ML IJ SOLN
INTRAMUSCULAR | Status: AC
  Filled 2013-05-18: qty 1

## 2013-05-18 MED ORDER — PROMETHAZINE HCL 12.5 MG PO TABS: 12.5000 mg | ORAL_TABLET | Freq: Four times a day (QID) | ORAL | Status: DC | PRN

## 2013-05-18 MED ORDER — CHLORHEXIDINE GLUCONATE 4 % EX LIQD: 60.0000 mL | Freq: Once | CUTANEOUS | Status: DC

## 2013-05-18 MED ORDER — CEFAZOLIN SODIUM-DEXTROSE 2-3 GM-% IV SOLR
2.0000 g | INTRAVENOUS | Status: AC
Start: 2013-05-18 — End: 2013-05-18
  Administered 2013-05-18: 2 g via INTRAVENOUS

## 2013-05-18 MED ORDER — MIDAZOLAM HCL 2 MG/2ML IJ SOLN
INTRAMUSCULAR | Status: AC
  Filled 2013-05-18: qty 2

## 2013-05-18 MED ORDER — LACTATED RINGERS IV SOLN
INTRAVENOUS | Status: DC
  Administered 2013-05-18 (×2): via INTRAVENOUS

## 2013-05-18 MED ORDER — ONDANSETRON HCL 4 MG/2ML IJ SOLN
4.0000 mg | Freq: Once | INTRAMUSCULAR | Status: AC
  Administered 2013-05-18: 4 mg via INTRAVENOUS

## 2013-05-18 MED ORDER — CEFAZOLIN SODIUM-DEXTROSE 2-3 GM-% IV SOLR
INTRAVENOUS | Status: AC
  Filled 2013-05-18: qty 50

## 2013-05-18 MED ORDER — SODIUM CHLORIDE 0.9 % IR SOLN
Status: DC | PRN
  Administered 2013-05-18: 1000 mL

## 2013-05-18 MED ORDER — SODIUM CHLORIDE 0.9 % IR SOLN
Status: DC | PRN
  Administered 2013-05-18 (×4)

## 2013-05-18 MED ORDER — EPINEPHRINE HCL 1 MG/ML IJ SOLN
INTRAMUSCULAR | Status: AC
  Filled 2013-05-18: qty 5

## 2013-05-18 MED ORDER — HYDROCODONE-ACETAMINOPHEN 10-325 MG PO TABS: 1.0000 | ORAL_TABLET | Freq: Four times a day (QID) | ORAL | Status: DC | PRN

## 2013-05-18 MED ORDER — GLYCOPYRROLATE 0.2 MG/ML IJ SOLN
0.2000 mg | Freq: Once | INTRAMUSCULAR | Status: AC
  Administered 2013-05-18: 0.2 mg via INTRAVENOUS

## 2013-05-18 SURGICAL SUPPLY — 46 items
BAG HAMPER (MISCELLANEOUS) ×3 IMPLANT
BANDAGE ELASTIC 6 VELCRO NS (GAUZE/BANDAGES/DRESSINGS) ×3 IMPLANT
BIT DRILL 2.4X128 (BIT) ×2 IMPLANT
BIT DRILL 2.4X128MM (BIT) ×1
BLADE 11 SAFETY STRL DISP (BLADE) ×3 IMPLANT
BLADE AGGRESSIVE PLUS 4.0 (BLADE) ×3 IMPLANT
CHLORAPREP W/TINT 26ML (MISCELLANEOUS) ×3 IMPLANT
CLOTH BEACON ORANGE TIMEOUT ST (SAFETY) ×3 IMPLANT
COOLER CRYO IC GRAV AND TUBE (ORTHOPEDIC SUPPLIES) ×3 IMPLANT
CUFF CRYO KNEE18X23 MED (MISCELLANEOUS) ×3 IMPLANT
CUFF TOURNIQUET SINGLE 34IN LL (TOURNIQUET CUFF) ×3 IMPLANT
DECANTER SPIKE VIAL GLASS SM (MISCELLANEOUS) ×6 IMPLANT
GAUZE SPONGE 4X4 16PLY XRAY LF (GAUZE/BANDAGES/DRESSINGS) ×3 IMPLANT
GAUZE XEROFORM 5X9 LF (GAUZE/BANDAGES/DRESSINGS) ×3 IMPLANT
GLOVE BIO SURGEON STRL SZ7 (GLOVE) ×3 IMPLANT
GLOVE BIOGEL PI IND STRL 7.0 (GLOVE) ×2 IMPLANT
GLOVE BIOGEL PI INDICATOR 7.0 (GLOVE) ×4
GLOVE SKINSENSE NS SZ8.0 LF (GLOVE) ×2
GLOVE SKINSENSE STRL SZ8.0 LF (GLOVE) ×1 IMPLANT
GLOVE SS N UNI LF 8.5 STRL (GLOVE) ×3 IMPLANT
GOWN STRL REUS W/TWL LRG LVL3 (GOWN DISPOSABLE) ×3 IMPLANT
GOWN STRL REUS W/TWL XL LVL3 (GOWN DISPOSABLE) ×3 IMPLANT
HLDR LEG FOAM (MISCELLANEOUS) ×1 IMPLANT
IV NS IRRIG 3000ML ARTHROMATIC (IV SOLUTION) ×12 IMPLANT
KIT BLADEGUARD II DBL (SET/KITS/TRAYS/PACK) ×3 IMPLANT
KIT ROOM TURNOVER AP CYSTO (KITS) ×3 IMPLANT
LEG HOLDER FOAM (MISCELLANEOUS) ×2
MANIFOLD NEPTUNE II (INSTRUMENTS) ×3 IMPLANT
MARKER SKIN DUAL TIP RULER LAB (MISCELLANEOUS) ×3 IMPLANT
NEEDLE HYPO 18GX1.5 BLUNT FILL (NEEDLE) ×3 IMPLANT
NEEDLE HYPO 21X1.5 SAFETY (NEEDLE) ×3 IMPLANT
NEEDLE SPNL 18GX3.5 QUINCKE PK (NEEDLE) ×3 IMPLANT
NS IRRIG 1000ML POUR BTL (IV SOLUTION) ×3 IMPLANT
PAD ABD 5X9 TENDERSORB (GAUZE/BANDAGES/DRESSINGS) ×3 IMPLANT
PAD ARMBOARD 7.5X6 YLW CONV (MISCELLANEOUS) ×3 IMPLANT
PADDING CAST COTTON 6X4 STRL (CAST SUPPLIES) ×3 IMPLANT
SET ARTHROSCOPY INST (INSTRUMENTS) ×3 IMPLANT
SET ARTHROSCOPY PUMP TUBE (IRRIGATION / IRRIGATOR) ×3 IMPLANT
SET BASIN LINEN APH (SET/KITS/TRAYS/PACK) ×3 IMPLANT
SPONGE GAUZE 4X4 12PLY (GAUZE/BANDAGES/DRESSINGS) ×3 IMPLANT
SUT ETHILON 3 0 FSL (SUTURE) ×3 IMPLANT
SYR 20CC LL (SYRINGE) ×3 IMPLANT
SYR 30ML LL (SYRINGE) ×3 IMPLANT
SYRINGE 10CC LL (SYRINGE) ×3 IMPLANT
WAND 90 DEG TURBOVAC W/CORD (SURGICAL WAND) ×3 IMPLANT
YANKAUER SUCT BULB TIP 10FT TU (MISCELLANEOUS) ×9 IMPLANT

## 2013-05-18 NOTE — Anesthesia Preprocedure Evaluation (Signed)
Anesthesia Evaluation  Patient identified by MRN, date of birth, ID band Patient awake    Reviewed: Allergy & Precautions, H&P , NPO status , Patient's Chart, lab work & pertinent test results  Airway Mallampati: II TM Distance: >3 FB Neck ROM: Full    Dental  (+) Teeth Intact, Partial Lower   Pulmonary neg pulmonary ROS,  breath sounds clear to auscultation        Cardiovascular negative cardio ROS  Rhythm:Regular Rate:Normal     Neuro/Psych    GI/Hepatic GERD- (inactive now)  ,  Endo/Other    Renal/GU      Musculoskeletal   Abdominal   Peds  Hematology   Anesthesia Other Findings Prostate Cancer   Reproductive/Obstetrics                           Anesthesia Physical Anesthesia Plan  ASA: II  Anesthesia Plan: General   Post-op Pain Management:    Induction: Intravenous  Airway Management Planned: LMA  Additional Equipment:   Intra-op Plan:   Post-operative Plan: Extubation in OR  Informed Consent: I have reviewed the patients History and Physical, chart, labs and discussed the procedure including the risks, benefits and alternatives for the proposed anesthesia with the patient or authorized representative who has indicated his/her understanding and acceptance.     Plan Discussed with:   Anesthesia Plan Comments:         Anesthesia Quick Evaluation

## 2013-05-18 NOTE — Interval H&P Note (Signed)
History and Physical Interval Note:  05/18/2013 10:10 AM  Brandon Norton  has presented today for surgery, with the diagnosis of Right Medial Meniscal Tear  The various methods of treatment have been discussed with the patient and family. After consideration of risks, benefits and other options for treatment, the patient has consented to  Procedure(s): KNEE ARTHROSCOPY WITH MEDIAL MENISECTOMY (Right) as a surgical intervention .  The patient's history has been reviewed, patient examined, no change in status, stable for surgery.  I have reviewed the patient's chart and labs.  Questions were answered to the patient's satisfaction.     Carole Civil

## 2013-05-18 NOTE — Anesthesia Procedure Notes (Signed)
Procedure Name: LMA Insertion Date/Time: 05/18/2013 11:16 AM Performed by: Vista Deck Pre-anesthesia Checklist: Patient identified, Patient being monitored, Emergency Drugs available, Timeout performed and Suction available Patient Re-evaluated:Patient Re-evaluated prior to inductionOxygen Delivery Method: Circle System Utilized Preoxygenation: Pre-oxygenation with 100% oxygen Intubation Type: IV induction Ventilation: Mask ventilation without difficulty LMA: LMA inserted LMA Size: 4.0 Number of attempts: 1 Placement Confirmation: positive ETCO2 and breath sounds checked- equal and bilateral Tube secured with: Tape

## 2013-05-18 NOTE — Anesthesia Postprocedure Evaluation (Signed)
  Anesthesia Post-op Note  Patient: Brandon Norton  Procedure(s) Performed: Procedure(s): RIGHT KNEE ARTHROSCOPY WITH PARTIAL MEDIAL MENISECTOMY AND MICROFRACTURE LATERAL FEMORAL CONDYLE (Right)  Patient Location: PACU  Anesthesia Type:General  Level of Consciousness: awake and patient cooperative  Airway and Oxygen Therapy: Patient Spontanous Breathing and non-rebreather face mask  Post-op Pain: moderate  Post-op Assessment: Post-op Vital signs reviewed, Patient's Cardiovascular Status Stable and Respiratory Function Stable  Post-op Vital Signs: Reviewed and stable  Last Vitals:  Filed Vitals:   05/18/13 1231  BP: 116/61  Pulse: 65  Temp: 36.8 C  Resp: 18    Complications: No apparent anesthesia complications

## 2013-05-18 NOTE — Brief Op Note (Signed)
05/18/2013  12:22 PM  PATIENT:  Brandon Norton  58 y.o. male  PRE-OPERATIVE DIAGNOSIS:  Right Medial Meniscal Tear  POST-OPERATIVE DIAGNOSIS:  Right Medial Meniscal Tear and Chondral Flap Tear Lateral Femoral Condyle  PROCEDURE:  Procedure(s): RIGHT KNEE ARTHROSCOPY WITH PARTIAL MEDIAL MENISECTOMY AND MICROFRACTURE LATERAL FEMORAL CONDYLE (Right)  Operative findings tear of the posterior horn of the medial meniscus Chondral flap tear of the lateral femoral condyle Grade 1 chondral changes of the medial tibial plateau and trochlea Grade 2 chondral changes patella Moderate amount of synovitis suprapatellar pouch   SURGEON:  Surgeon(s) and Role:    * Carole Civil, MD - Primary  PHYSICIAN ASSISTANT:   ASSISTANTS: none   ANESTHESIA:   general  EBL:  Total I/O In: 600 [I.V.:600] Out: 0   BLOOD ADMINISTERED:none  DRAINS: none   LOCAL MEDICATIONS USED:  MARCAINE   , Amount: 30 ml and OTHER epi  SPECIMEN:  No Specimen  DISPOSITION OF SPECIMEN:  N/A  COUNTS:  YES  TOURNIQUET:    DICTATION: .Dragon Dictation  PLAN OF CARE: Discharge to home after PACU  PATIENT DISPOSITION:  PACU - hemodynamically stable.   Delay start of Pharmacological VTE agent (>24hrs) due to surgical blood loss or risk of bleeding: not applicable  Details of procedure The patient was identified in the preoperative holding area followed by confirmation and surgical site marking. Chart update was completed.  The patient was taken back to the operating room for general anesthesia and was given appropriate amount of Ancef IV. In the supine position the patient was prepped and draped in sterile technique. Followed by timeout.  Standard lateral portal was established the scope was placed into the joint into the medial compartment. The medial compartment showed a torn medial meniscus with chondral changes of the tibial plateau. The anterior cruciate ligament and PCL were intact.  Lateral meniscus  was intact the lateral joint had chondral changes mild as well. There was a chondral flap tear of the lateral femoral condyle.  The medial meniscal tear was addressed with resection removal of meniscal fragments and balancing of the meniscus with the shaver. Stable rim was confirmed with the probe.  The chondral flap was debrided. Cartilaginous fragments were removed. Microfracture was performed.  The lesion was 9 mm wide by 6 mm long.  Lesion was on the weightbearing surface.  Synovectomy was performed in limited fashion.  The joint was then irrigated and then portal sites were closed with 3-0 nylon sutures, sterile dressings were applied and a Cryo/Cuff was placed and activated.  Note accessory portal was made to address the lateral femoral condyle lesion to gain access in a perpendicular fashion.  Subcutaneous chondral bone fracture was confirmed by blood leaking from the fracture sites.   The patient will be taken to recovery in stable condition

## 2013-05-18 NOTE — Transfer of Care (Signed)
Immediate Anesthesia Transfer of Care Note  Patient: Brandon Norton  Procedure(s) Performed: Procedure(s) (LRB): RIGHT KNEE ARTHROSCOPY WITH PARTIAL MEDIAL MENISECTOMY AND MICROFRACTURE LATERAL FEMORAL CONDYLE (Right)  Patient Location: PACU  Anesthesia Type: General  Level of Consciousness: awake  Airway & Oxygen Therapy: Patient Spontanous Breathing and non-rebreather face mask  Post-op Assessment: Report given to PACU RN, Post -op Vital signs reviewed and stable and Patient moving all extremities  Post vital signs: Reviewed and stable  Complications: No apparent anesthesia complications

## 2013-05-18 NOTE — Discharge Instructions (Signed)
Procedimiento artroscpico en la rodilla - Cuidados posteriores  (Arthroscopic Procedure, Knee, Care After)  Siga estas instrucciones durante las prximas semanas. Estas indicaciones le proporcionan informacin general acerca de cmo deber cuidarse despus de dejar el hospital. El mdico tambin podr darle instrucciones ms especficas. El tratamiento se ha planificado de acuerdo con las prcticas mdicas disponibles ms recientes, ocasionalmente pueden ocurrir complicaciones inevitables. Si tiene problemas o surgen preguntas luego de recibir el alta, por favor comunquese con su mdico. INSTRUCCIONES PARA EL CUIDADO EN EL HOGAR   Ser normal que se sienta dolorido durante un par [employer] de la [place]. Consulte con su mdico si parece empeorar en lugar de mejorar.  Slo tome medicamentos de venta libre o recetados para Glass blower/designer, el Tree surgeon o bajar la fiebre, segn las indicaciones de su [place] slo duchas y no baos, o segn las indicaciones de su mdico.  [employer] apsitos (vendajes) si es necesario o segn se lo hayan indicado.  Puede reanudar su dieta y sus actividades normales segn las indicaciones.  Evite levantar objetos pesados hasta que le indiquen lo contrario.  Haga una cita con el mdico para retirar los puntos (suturas) o las grapas segn le hayan indicado.  Podr aplicar hielo en la zona.  Coloque el hielo en una bolsa plstica. Colquese una toalla entre la piel y la bolsa de hielo.  Deje la bolsa de hielo durante 15 a 20 minutos, 3 a 4 veces por da, durante los 2 primeros das.  Eleve la cadera por encima del nivel del corazn para reducir la hinchazn, y evite balancear la pierna.  Haga entre 10 y 54 bombeos con el tobillo (apuntando con los dedos de los pies hacia usted y Air traffic controller en el sentido contrario) dos o tres veces por Training and development officer.  Si le indicaron que use medias de compresin despus de la Leisure centre manager, selas durante el tiempo que el cirujano le  indique (alrededor de 10-14 das).   Evite fumar o aspirar el humo de otros fumadores. SOLICITE ATENCIN MDICA SI:   [place]man Pies un aumento del sangrado en las heridas.  Observa enrojecimiento, hinchazn o aumento del [employer].  Observa pus que proviene de la herida.  Tiene fiebre o sntomas persistentes durante ms de 2 - 3 das.  Advierte un olor ftido que proviene de la herida o del vendaje.  Siente dolor intenso ante cualquier movimiento de la rodilla. SOLICITE ATENCIN MDICA DE INMEDIATO SI:   Aparece una erupcin cutnea.  Tiene dificultad para respirar.  Aparece alguna reaccin o efecto secundario por los medicamentos que toma.  Comienza a Animal nutritionist las pantorrillas o en la parte posterior de la rodilla.  Siente dolor en el pecho, le falta el aire o tiene dificultad para Ambulance person.  Tiene adormecimiento u hormigueos en las piernas o en los pies. ASEGRESE DE QUE:   Comprende estas instrucciones.  Controlar su afeccin.  Recibir ayuda de inmediato si no mejora o si empeora. Document Released: 12/15/2011 Document Revised: 08/30/2012 [facility] Patient Information 2014 [place], [place]. PATIENT INSTRUCTIONS POST-ANESTHESIA  IMMEDIATELY FOLLOWING SURGERY:  Do not drive or operate machinery for the first twenty four hours after surgery.  Do not make any important decisions for twenty four hours after surgery or while taking narcotic pain medications or sedatives.  If you develop intractable nausea and vomiting or a severe headache please notify your doctor immediately.  FOLLOW-UP:  Please make an appointment with your surgeon as instructed. You do not need  to follow up with anesthesia unless specifically instructed to do so.  WOUND CARE INSTRUCTIONS (if applicable):  Keep a dry clean dressing on the anesthesia/puncture wound site if there is drainage.  Once the wound has quit draining you may leave it open to air.  Generally you should leave the bandage  intact for twenty four hours unless there is drainage.  If the epidural site drains for more than 36-48 hours please call the anesthesia department.  QUESTIONS?:  Please feel free to call your physician or the hospital operator if you have any questions, and they will be happy to assist you.

## 2013-05-18 NOTE — Op Note (Signed)
05/18/2013  12:22 PM  PATIENT:  Brandon Norton  57 y.o. male  PRE-OPERATIVE DIAGNOSIS:  Right Medial Meniscal Tear  POST-OPERATIVE DIAGNOSIS:  Right Medial Meniscal Tear and Chondral Flap Tear Lateral Femoral Condyle  PROCEDURE:  Procedure(s): RIGHT KNEE ARTHROSCOPY WITH PARTIAL MEDIAL MENISECTOMY AND MICROFRACTURE LATERAL FEMORAL CONDYLE (Right)  Operative findings tear of the posterior horn of the medial meniscus Chondral flap tear of the lateral femoral condyle Grade 1 chondral changes of the medial tibial plateau and trochlea Grade 2 chondral changes patella Moderate amount of synovitis suprapatellar pouch   SURGEON:  Surgeon(s) and Role:    * Coletta Lockner E Kainat Pizana, MD - Primary  PHYSICIAN ASSISTANT:   ASSISTANTS: none   ANESTHESIA:   general  EBL:  Total I/O In: 600 [I.V.:600] Out: 0   BLOOD ADMINISTERED:none  DRAINS: none   LOCAL MEDICATIONS USED:  MARCAINE   , Amount: 30 ml and OTHER epi  SPECIMEN:  No Specimen  DISPOSITION OF SPECIMEN:  N/A  COUNTS:  YES  TOURNIQUET:    DICTATION: .Dragon Dictation  PLAN OF CARE: Discharge to home after PACU  PATIENT DISPOSITION:  PACU - hemodynamically stable.   Delay start of Pharmacological VTE agent (>24hrs) due to surgical blood loss or risk of bleeding: not applicable  Details of procedure The patient was identified in the preoperative holding area followed by confirmation and surgical site marking. Chart update was completed.  The patient was taken back to the operating room for general anesthesia and was given appropriate amount of Ancef IV. In the supine position the patient was prepped and draped in sterile technique. Followed by timeout.  Standard lateral portal was established the scope was placed into the joint into the medial compartment. The medial compartment showed a torn medial meniscus with chondral changes of the tibial plateau. The anterior cruciate ligament and PCL were intact.  Lateral meniscus  was intact the lateral joint had chondral changes mild as well. There was a chondral flap tear of the lateral femoral condyle.  The medial meniscal tear was addressed with resection removal of meniscal fragments and balancing of the meniscus with the shaver. Stable rim was confirmed with the probe.  The chondral flap was debrided. Cartilaginous fragments were removed. Microfracture was performed.  The lesion was 9 mm wide by 6 mm long.  Lesion was on the weightbearing surface.  Synovectomy was performed in limited fashion.  The joint was then irrigated and then portal sites were closed with 3-0 nylon sutures, sterile dressings were applied and a Cryo/Cuff was placed and activated.  Note accessory portal was made to address the lateral femoral condyle lesion to gain access in a perpendicular fashion.  Subcutaneous chondral bone fracture was confirmed by blood leaking from the fracture sites.   The patient will be taken to recovery in stable condition  

## 2013-05-18 NOTE — Interval H&P Note (Signed)
History and Physical Interval Note:  05/18/2013 10:22 AM  Brandon Norton  has presented today for surgery, with the diagnosis of Right Medial Meniscal Tear  The various methods of treatment have been discussed with the patient and family. After consideration of risks, benefits and other options for treatment, the patient has consented to  Procedure(s): KNEE ARTHROSCOPY WITH MEDIAL MENISECTOMY (Right) as a surgical intervention .  The patient's history has been reviewed, patient examined, no change in status, stable for surgery.  I have reviewed the patient's chart and labs.  Questions were answered to the patient's satisfaction.     Carole Civil

## 2013-05-21 ENCOUNTER — Ambulatory Visit (INDEPENDENT_AMBULATORY_CARE_PROVIDER_SITE_OTHER): Payer: Worker's Compensation | Admitting: Orthopedic Surgery

## 2013-05-21 ENCOUNTER — Telehealth: Payer: Self-pay | Admitting: Orthopedic Surgery

## 2013-05-21 VITALS — BP 143/83 | Ht 66.0 in | Wt 188.0 lb

## 2013-05-21 DIAGNOSIS — M23329 Other meniscus derangements, posterior horn of medial meniscus, unspecified knee: Secondary | ICD-10-CM

## 2013-05-21 DIAGNOSIS — Z9889 Other specified postprocedural states: Secondary | ICD-10-CM

## 2013-05-21 NOTE — Telephone Encounter (Signed)
Faxed physical therapy order to [facility], Fax [phone], requesting a call back regarding the scheduling of physical therapy there.  Hospital may use "med-pay" Vs. Worker's compensation; patient also has coverage with [employer] under spouse.  Pending further follow up.

## 2013-05-21 NOTE — Progress Notes (Signed)
Patient ID: Brandon Norton, male   DOB: 10-Apr-1955, 58 y.o.   MRN: [id number]  Chief Complaint  Patient presents with  . Follow-up    Post opn # 1 SARK DOS 05/18/13   Encounter Diagnoses  Name Primary?  . S/P knee surgery Yes  . Medial meniscus, posterior horn derangement     The patient developed a rash over the proximal aspect of his knee most likely secondary to dressing has no other rash itching or hives anywhere else  His incisions x3 are healing nicely. Sutures were removed.  Recommend followup status post therapy in 4 weeks. Continue brace wear left knee which at this point is feeling fine.

## 2013-05-21 NOTE — Patient Instructions (Signed)
Get therapy arranged with workers comp

## 2013-05-22 ENCOUNTER — Encounter (HOSPITAL_COMMUNITY): Payer: Self-pay | Admitting: Orthopedic Surgery

## 2013-05-23 NOTE — Telephone Encounter (Signed)
Called back to [facility]; spoke with [place].  Confirmed patient's orders are there; she is aware of insurances; patient aware to go ahead and schedule therapy appointment there.

## 2013-06-06 ENCOUNTER — Telehealth: Payer: Self-pay | Admitting: Orthopedic Surgery

## 2013-06-06 ENCOUNTER — Ambulatory Visit (HOSPITAL_COMMUNITY)
Admission: RE | Admit: 2013-06-06 | Discharge: 2013-06-06 | Disposition: A | Discharge status: Home / Self Care | Payer: Worker's Compensation | Source: Ambulatory Visit | Attending: Orthopedic Surgery | Admitting: Orthopedic Surgery

## 2013-06-06 ENCOUNTER — Encounter: Payer: Self-pay | Admitting: Orthopedic Surgery

## 2013-06-06 DIAGNOSIS — R262 Difficulty in walking, not elsewhere classified: Secondary | ICD-10-CM | POA: Insufficient documentation

## 2013-06-06 DIAGNOSIS — M25561 Pain in right knee: Secondary | ICD-10-CM

## 2013-06-06 NOTE — Telephone Encounter (Signed)
Copy of work notes faxed to patient's employer, work-related injury, to attention (owner) [employer], at [phone].  Patient aware.

## 2013-06-06 NOTE — Evaluation (Signed)
Physical Therapy Evaluation  Patient Details  Name: Brandon Norton MRN: [id number] Date of Birth: 03/21/1955  Today's Date: 06/06/2013 Time: 0998-3382 PT Time Calculation (min): 40 min Charge:  Evaluation.             Visit#: 1 of 12  Re-eval: 07/06/13 Assessment Diagnosis: SARK Surgical Date: 05/18/13 Prior Therapy: none  Authorization: [employer]    Authorization Time Period:    Authorization Visit#:   of     Past Medical History:  Past Medical History  Diagnosis Date  . Cancer   . Prostate cancer   . GERD (gastroesophageal reflux disease)   . 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 [place] cath    . Knee arthroscopy with medial menisectomy Right 05/18/2013    Procedure: RIGHT KNEE ARTHROSCOPY WITH PARTIAL MEDIAL MENISECTOMY AND MICROFRACTURE LATERAL FEMORAL CONDYLE;  Surgeon: Carole Civil, MD;  Location: [facility];  Service: Orthopedics;  Laterality: Right;    Subjective Symptoms/Limitations Symptoms: Pt comes to the department ambulating with a walker and a knee brace.  He states he is still having pain but it is better than prior to his surgery.   How long can you sit comfortably?: able to sit for 30 minutes but it is stiff after he gets up  How long can you stand comfortably?: 30-40 minutes How long can you walk comfortably?: 30 minutes with walker Pain Assessment Currently in Pain?: Yes Pain Score: 4  (at night pain can go up as high as an 8) Pain Location: Knee Pain Orientation: Right Pain Type: Surgical pain Pain Onset: 1 to 4 weeks ago Pain Relieving Factors: ice  Effect of Pain on Daily Activities: increases  Precautions/Restrictions     Balance Screening Balance Screen Has the patient fallen in the past 6 months: No  Prior Functioning  Prior Function Vocation: Full time employment Vocation Requirements: Health visitor Leisure: Hobbies-no Comments: garden     Sensation/Coordination/Flexibility/Functional Tests Functional Tests Functional Tests: foto 51  Assessment RLE AROM (degrees) Right Knee Extension: 8 Right Knee Flexion: 120 RLE Strength Right Hip Flexion: 5/5 Right Hip Extension: 3+/5 Right Hip ABduction: 5/5 Right Hip ADduction: 5/5 Right Knee Flexion:  (4-/5) Right Knee Extension: 4/5 Right Ankle Dorsiflexion: 5/5  Exercise/Treatments Mobility/Balance  Static Standing Balance Single Leg Stance - Right Leg: 10 Single Leg Stance - Left Leg: 10   Stretches Active Hamstring Stretch: 3 reps;30 seconds    Standing Functional Squat: 5 reps SLS:  (3x 10 seconds) Seated   Supine Quad Sets: 10 reps Short Arc Quad Sets: 10 reps    Prone  Hamstring Curl: 10 reps Hip Extension: 10 reps    Physical Therapy Assessment and Plan PT Assessment and Plan Clinical Impression Statement: Pt is a 58 yo s/p Right arthroscopic surgery for repair of his medial meniscus.  Pt is normallly ambulatory without an assistive device.  He comes to the department walking with a walker and B knee braces.  Exam demonstrates decreased ROM, decreased strength and decreased balance.  Pt will benefit from skilled PT to return to previous functional activity level.  Pt will benefit from skilled therapeutic intervention in order to improve on the following deficits: Decreased balance;Decreased activity tolerance;Pain;Decreased strength;Difficulty walking;Decreased range of motion Rehab Potential: Good PT Frequency: Min 3X/week PT Duration: 4 weeks PT Treatment/Interventions: Gait training;Stair training;Functional mobility training;Therapeutic activities;Therapeutic exercise;Balance training PT Plan: begin nustep, rockerboard, standing knee flexion, heel raise  and step ups.    Goals Home Exercise Program Pt/caregiver will Perform Home Exercise Program: For increased strengthening PT Short Term Goals Time to Complete Short Term Goals: 2 weeks PT  Short Term Goal 1: Pain level no greater than a 5/10 PT Short Term Goal 2: Pt to be able to ambulate x 45 minutes PT Short Term Goal 3: Pt to begin weaning away from knee brace. PT Short Term Goal 4: ROM to 130 to allow pt to squat down to pick up object. PT Long Term Goals Time to Complete Long Term Goals: 4 weeks PT Long Term Goal 1: Pain level to be no greater than a 2/10  PT Long Term Goal 2: Pt to be able to tolerate being on his feet for two hours to allow pt to return to work duties. Long Term Goal 3: Pt ambulating without knee brace. Long Term Goal 4: strength 5/5 to allow sit to stand without difficulty, stair climbing without difficulty. PT Long Term Goal 5: ROM to be 0 degrees to allow normalized gt pattern  Problem List Patient Active Problem List   Diagnosis Date Noted  . Difficulty in walking(719.7) 06/06/2013  . Pain in right knee 06/06/2013  . 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 Abnormal MRI 11/29/2011  . Epigastric pain 11/29/2011  . H. pylori infection 11/29/2011    General Behavior During Therapy: [name] for tasks assessed/performed PT Plan of Care PT Home Exercise Plan: given   GP    Leeroy Cha 06/06/2013, 2:11 PM  Physician Documentation Your signature is required to indicate approval of the treatment plan as stated above.  Please sign and either send electronically or make a copy of this report for your files and return this physician signed original.   Please mark one 1.__approve of plan  2. ___approve of plan with the following conditions.   ______________________________                                                          _____________________ Physician Signature                                                                                                             Date

## 2013-06-11 ENCOUNTER — Telehealth (HOSPITAL_COMMUNITY): Payer: Self-pay | Admitting: Physical Therapy

## 2013-06-11 ENCOUNTER — Inpatient Hospital Stay (HOSPITAL_COMMUNITY): Admission: RE | Admit: 2013-06-11 | Payer: Self-pay | Source: Ambulatory Visit | Admitting: Physical Therapy

## 2013-06-13 ENCOUNTER — Encounter (HOSPITAL_COMMUNITY): Payer: 59

## 2013-06-13 ENCOUNTER — Ambulatory Visit (HOSPITAL_COMMUNITY)
Admission: RE | Admit: 2013-06-13 | Discharge: 2013-06-13 | Disposition: A | Discharge status: Home / Self Care | Payer: Worker's Compensation | Source: Ambulatory Visit | Attending: Family Medicine | Admitting: Family Medicine

## 2013-06-13 DIAGNOSIS — R262 Difficulty in walking, not elsewhere classified: Secondary | ICD-10-CM | POA: Insufficient documentation

## 2013-06-13 DIAGNOSIS — IMO0001 Reserved for inherently not codable concepts without codable children: Secondary | ICD-10-CM | POA: Diagnosis present

## 2013-06-13 DIAGNOSIS — Z96659 Presence of unspecified artificial knee joint: Secondary | ICD-10-CM | POA: Diagnosis not present

## 2013-06-13 DIAGNOSIS — M25569 Pain in unspecified knee: Secondary | ICD-10-CM | POA: Diagnosis not present

## 2013-06-13 DIAGNOSIS — M25561 Pain in right knee: Secondary | ICD-10-CM

## 2013-06-13 NOTE — Progress Notes (Signed)
Physical Therapy Treatment Patient Details  Name: Brandon Norton MRN: [id number] Date of Birth: 08/22/1955  Today's Date: 06/13/2013 Time: 0277-4128 PT Time Calculation (min): 42 min Charge: TE 7867-6720  Visit#: 2 of 12  Re-eval: 07/06/13 Assessment Diagnosis: SARK Surgical Date: 05/18/13 Next MD Visit: Anders Grant 06/18/2013  Authorization: Christella Scheuermann  Authorization Time Period:    Authorization Visit#:   of     Subjective: Symptoms/Limitations Symptoms: Pt reports compliance with HEP daily with no questions about any exercise.  Pt came into dept. ambulating with no AD, braces on Bil knees.  Current pain scale 3-4/10 Pain Assessment Currently in Pain?: Yes Pain Score: 4  Pain Location: Knee Pain Orientation: Right  Objective:   Exercise/Treatments Stretches Active Hamstring Stretch: 3 reps;30 seconds Aerobic Stationary Bike: Nustep 8 min hill level 3, resistance 3 SPM 75 Standing Heel Raises: 10 reps;Limitations Heel Raises Limitations: Toe raises Knee Flexion: 10 reps;Limitations Knee Flexion Limitations: 3# Lateral Step Up: Right;10 reps;Hand Hold: 1;Step Height: 4" Functional Squat: Limitations;10 reps Functional Squat Limitations: 3D hip excursion SLS: L 17", Rt 14" max of 3 Supine Quad Sets: 2 sets;10 reps Short Arc Quad Sets: 15 reps      Physical Therapy Assessment and Plan PT Assessment and Plan Clinical Impression Statement: Began PT POC for functional strengthening and to improve knee extension.  Pt demonstrated appropriate technique with exericses following cueing, with therapist facilitation to improve form  with squats and reduce compensation with lateral step-ups. Pt plans to apply ice at home for ice and edema control.   PT Plan: Continue with current POC, progress strengthening and improve extension.    Goals Home Exercise Program Pt/caregiver will Perform Home Exercise Program: For increased strengthening PT Short Term Goals Time to Complete Short  Term Goals: 2 weeks PT Short Term Goal 1: Pain level no greater than a 5/10 PT Short Term Goal 2: Pt to be able to ambulate x 45 minutes PT Short Term Goal 2 - Progress: Progressing toward goal PT Short Term Goal 3: Pt to begin weaning away from knee brace. PT Short Term Goal 3 - Progress: Progressing toward goal PT Short Term Goal 4: ROM to 130 to allow pt to squat down to pick up object. PT Short Term Goal 4 - Progress: Progressing toward goal PT Long Term Goals Time to Complete Long Term Goals: 4 weeks PT Long Term Goal 1: Pain level to be no greater than a 2/10  PT Long Term Goal 2: Pt to be able to tolerate being on his feet for two hours to allow pt to return to work duties. Long Term Goal 3: Pt ambulating without knee brace. Long Term Goal 4: strength 5/5 to allow sit to stand without difficulty, stair climbing without difficulty. Long Term Goal 4 Progress: Progressing toward goal Long Term Goal 5 Progress: Progressing toward goal  Problem List Patient Active Problem List   Diagnosis Date Noted  . Difficulty in walking(719.7) 06/06/2013  . Pain in right knee 06/06/2013  . 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 Abnormal MRI 11/29/2011  . Epigastric pain 11/29/2011  . H. pylori infection 11/29/2011    PT - End of Session Activity Tolerance: Patient tolerated treatment well General Behavior During Therapy: [facility] for tasks assessed/performed  GP    Aldona Lento 06/13/2013, 12:10 PM

## 2013-06-14 ENCOUNTER — Ambulatory Visit (HOSPITAL_COMMUNITY)
Admission: RE | Admit: 2013-06-14 | Discharge: 2013-06-14 | Disposition: A | Discharge status: Home / Self Care | Payer: Worker's Compensation | Source: Ambulatory Visit

## 2013-06-14 DIAGNOSIS — IMO0001 Reserved for inherently not codable concepts without codable children: Secondary | ICD-10-CM | POA: Diagnosis not present

## 2013-06-14 DIAGNOSIS — R262 Difficulty in walking, not elsewhere classified: Secondary | ICD-10-CM

## 2013-06-14 DIAGNOSIS — M25561 Pain in right knee: Secondary | ICD-10-CM

## 2013-06-18 ENCOUNTER — Encounter: Payer: Self-pay | Admitting: Orthopedic Surgery

## 2013-06-18 ENCOUNTER — Ambulatory Visit (INDEPENDENT_AMBULATORY_CARE_PROVIDER_SITE_OTHER): Payer: Worker's Compensation | Admitting: Orthopedic Surgery

## 2013-06-18 VITALS — BP 144/80 | Ht 66.0 in | Wt 188.0 lb

## 2013-06-18 DIAGNOSIS — S8990XA Unspecified injury of unspecified lower leg, initial encounter: Secondary | ICD-10-CM

## 2013-06-18 DIAGNOSIS — Z9889 Other specified postprocedural states: Secondary | ICD-10-CM

## 2013-06-18 DIAGNOSIS — S99929A Unspecified injury of unspecified foot, initial encounter: Secondary | ICD-10-CM

## 2013-06-18 DIAGNOSIS — S99919A Unspecified injury of unspecified ankle, initial encounter: Secondary | ICD-10-CM

## 2013-06-18 NOTE — Patient Instructions (Signed)
Continue therapy until next appointment and continue to wear braces You may drive now

## 2013-06-18 NOTE — Progress Notes (Signed)
Patient ID: Brandon Norton, male   DOB: [birth year], 58 y.o.   MRN: [id number]  Chief Complaint  Patient presents with  . Follow-up    Post op # 2 SARK DOS 05/18/13   Workers compensation related injury   PRE-OPERATIVE DIAGNOSIS:  Right Medial Meniscal Tear  POST-OPERATIVE DIAGNOSIS:  Right Medial Meniscal Tear and Chondral Flap Tear Lateral Femoral Condyle  PROCEDURE:  Procedure(s): RIGHT KNEE ARTHROSCOPY WITH PARTIAL MEDIAL MENISECTOMY AND MICROFRACTURE LATERAL FEMORAL CONDYLE (Right)  Operative findings tear of the posterior horn of the medial meniscus Chondral flap tear of the lateral femoral condyle Grade 1 chondral changes of the medial tibial plateau and trochlea Grade 2 chondral changes patella Moderate amount of synovitis suprapatellar pouch  The patient also injured his left knee  He continues to make excellent progress. He is having mild discomfort on the right stiffness after sitting on the left. Today's examination shows 125 of knee flexion on the right passive 115 active. He is 115 active on the left  Is minimal joint effusion on the right  Has minimal tenderness to either knee  He should continue therapy followup with me on the 29th and he shouldn't go to work after that  continue bracing in both knees

## 2013-07-04 ENCOUNTER — Ambulatory Visit (HOSPITAL_COMMUNITY): Payer: Self-pay | Admitting: Physical Therapy

## 2013-07-06 ENCOUNTER — Ambulatory Visit (HOSPITAL_COMMUNITY)
Admission: RE | Admit: 2013-07-06 | Discharge: 2013-07-06 | Disposition: A | Discharge status: Home / Self Care | Payer: Worker's Compensation | Source: Ambulatory Visit | Attending: Family Medicine | Admitting: Family Medicine

## 2013-07-06 DIAGNOSIS — R262 Difficulty in walking, not elsewhere classified: Secondary | ICD-10-CM

## 2013-07-06 DIAGNOSIS — IMO0001 Reserved for inherently not codable concepts without codable children: Secondary | ICD-10-CM | POA: Diagnosis not present

## 2013-07-06 DIAGNOSIS — M25561 Pain in right knee: Secondary | ICD-10-CM

## 2013-07-06 NOTE — Evaluation (Signed)
Physical Therapy Evaluation  Patient Details  Name: Brandon Norton MRN: [id number] Date of Birth: 06/11/1955  Today's Date: 07/06/2013 Time: 1530-1605 PT Time Calculation (min): 35 min Charge:  MM test 1530-1540 there ex 1540-1605             Visit#: 4 of    Re-eval:   Assessment Diagnosis: SARK  Past Medical History:  Past Medical History  Diagnosis Date  . Cancer   . Prostate cancer   . GERD (gastroesophageal reflux disease)   . 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 [place] cath    . Knee arthroscopy with medial menisectomy Right 05/18/2013    Procedure: RIGHT KNEE ARTHROSCOPY WITH PARTIAL MEDIAL MENISECTOMY AND MICROFRACTURE LATERAL FEMORAL CONDYLE;  Surgeon: Carole Civil, MD;  Location: [facility];  Service: Orthopedics;  Laterality: Right;    Subjective Symptoms/Limitations Symptoms: Pt has not been to treatment since 06/17/2012.  States he had a porta cath placed and this is why he did not return. How long can you sit comfortably?: able to sit for 30 minutes. How long can you stand comfortably?: able to stand for 40 minutes was 40 minutes  How long can you walk comfortably?: Pt is walking without his walker now and is able to walk an hour was walking 30 minutes wtih his walker.  Pain Assessment Currently in Pain?: Yes Pain Score: 3  Pain Location: Knee Pain Orientation: Right    Assessment RLE AROM (degrees) Right Knee Extension: 5 (was 8) Right Knee Flexion: 125 RLE Strength Right Hip Flexion: 5/5 Right Hip Extension: 5/5 (was 3+5) Right Hip ABduction: 5/5 Right Hip ADduction: 5/5 Right Knee Flexion: 5/5 Right Knee Extension: 5/5 (was 4/5) Right Ankle Dorsiflexion: 5/5  Exercise/Treatments   Stretches Active Hamstring Stretch: 3 reps;30 seconds Passive Hamstring Stretch: 1 rep;60 seconds Aerobic Stationary Bike: Nustep 8 min hill level 3, resistance 3 SPM  99 Standing Heel Raises: 15 reps;Limitations Functional Squat: 15 reps Stairs: one flight    Supine Quad Sets: 10 reps Heel Slides: 5 reps Knee Extension: PROM    Physical Therapy Assessment and Plan PT Assessment and Plan Clinical Impression Statement: Pt treatment has been limited secondary to having a second operation.  Pt appears to be at prior level with his Rt knee although he does have some funcitonal limitations secondary to his Lt knee.  Pt will be discharged for Rt knee rehab but might benefit from Lt  PT Plan: discharge.    Goals Home Exercise Program Pt/caregiver will Perform Home Exercise Program: For increased strengthening PT Short Term Goals Time to Complete Short Term Goals: 2 weeks PT Short Term Goal 1: Pain level no greater than a 5/10 PT Short Term Goal 1 - Progress: Met PT Short Term Goal 2: Pt to be able to ambulate x 45 minutes PT Short Term Goal 2 - Progress: Progressing toward goal PT Short Term Goal 3: Pt to begin weaning away from knee brace. PT Short Term Goal 3 - Progress: Progressing toward goal PT Short Term Goal 4: ROM to 130 to allow pt to squat down to pick up object. PT Short Term Goal 4 - Progress: Met PT Long Term Goals PT Long Term Goal 1: Pain level to be no greater than a 2/10  PT Long Term Goal 1 - Progress: Progressing toward goal PT Long Term Goal 2: Pt to be able to tolerate being on his feet  for two hours to allow pt to return to work duties. PT Long Term Goal 2 - Progress: Not met Long Term Goal 3: Pt ambulating without knee brace. Long Term Goal 3 Progress: Not met Long Term Goal 4: strength 5/5 to allow sit to stand without difficulty, stair climbing without difficulty. Long Term Goal 4 Progress: Not met PT Long Term Goal 5: ROM to be 0 degrees to allow normalized gt pattern Long Term Goal 5 Progress: Progressing toward goal  Problem List Patient Active Problem List   Diagnosis Date Noted  . Difficulty in walking(719.7)  06/06/2013  . Pain in right knee 06/06/2013  . 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 Abnormal MRI 11/29/2011  . Epigastric pain 11/29/2011  . H. pylori infection 11/29/2011       GP    RUSSELL,CINDY 07/06/2013, 5:24 PM  Physician Documentation Your signature is required to indicate approval of the treatment plan as stated above.  Please sign and either send electronically or make a copy of this report for your files and return this physician signed original.   Please mark one 1.__approve of plan  2. ___approve of plan with the following conditions.   ______________________________                                                          _____________________ Physician Signature                                                                                                             Date SBNR

## 2013-07-09 ENCOUNTER — Ambulatory Visit (HOSPITAL_COMMUNITY): Payer: Self-pay | Admitting: Physical Therapy

## 2013-07-10 ENCOUNTER — Ambulatory Visit (INDEPENDENT_AMBULATORY_CARE_PROVIDER_SITE_OTHER): Payer: Worker's Compensation | Admitting: Orthopedic Surgery

## 2013-07-10 ENCOUNTER — Encounter: Payer: Self-pay | Admitting: Orthopedic Surgery

## 2013-07-10 VITALS — BP 141/88 | Ht 66.0 in | Wt 188.0 lb

## 2013-07-10 DIAGNOSIS — Z5189 Encounter for other specified aftercare: Secondary | ICD-10-CM

## 2013-07-10 DIAGNOSIS — M175 Other unilateral secondary osteoarthritis of knee: Secondary | ICD-10-CM

## 2013-07-10 DIAGNOSIS — S8990XA Unspecified injury of unspecified lower leg, initial encounter: Secondary | ICD-10-CM

## 2013-07-10 DIAGNOSIS — S99919A Unspecified injury of unspecified ankle, initial encounter: Secondary | ICD-10-CM

## 2013-07-10 DIAGNOSIS — S99929A Unspecified injury of unspecified foot, initial encounter: Secondary | ICD-10-CM

## 2013-07-10 DIAGNOSIS — Z9889 Other specified postprocedural states: Secondary | ICD-10-CM

## 2013-07-10 DIAGNOSIS — S8992XD Unspecified injury of left lower leg, subsequent encounter: Secondary | ICD-10-CM

## 2013-07-10 MED ORDER — DICLOFENAC POTASSIUM 50 MG PO TABS: 50.0000 mg | ORAL_TABLET | Freq: Two times a day (BID) | ORAL | Status: DC

## 2013-07-10 MED ORDER — TRAMADOL-ACETAMINOPHEN 37.5-325 MG PO TABS: 1.0000 | ORAL_TABLET | ORAL | Status: DC | PRN

## 2013-07-10 NOTE — Progress Notes (Signed)
Patient ID: Brandon Norton, male   DOB: 02-11-1955, 58 y.o.   MRN: [id number] Chief Complaint  Patient presents with  . Follow-up    Bilateral knee recheck, post op SARK 05/18/13, discuss surgery Left knee, DOI 05/02/13    Right knee operative findings torn posterior horn medial meniscus chondral flap tear lateral femoral condyle grade 1 changes medial tibial plateau and trochlea grade 2 changes patella synovitis.  His right knee looks good he has minimal symptoms there is any. He completed therapy. He is happy with the right knee.  Left knee complains of pain no catching locking or giving way this was explained to his daughter as an interpreter. His primary problem seems to be pain his MRI does show a small medial meniscal tear or he does have some medial pain he a lateral femoral condyle fracture and has some mild lateral joint line tenderness. His knee flexion is limited to 120 with a slight flexion contracture which I believe is chronic. He seems to have more arthritic symptoms as he complains of stiffness after sitting. Has minimal pain weightbearing. Again no mechanical symptoms  No meniscal signs  Recommend injection and medical management.  Start diclofenac 50 mg twice a day, Ultracet for severe pain. Inject left knee.  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 Return to office one month.  Continue bracing left knee start diclofenac and Ultracet  Follow up one month

## 2013-07-10 NOTE — Patient Instructions (Addendum)
RTW OFFICE ONLY START MON (UNTIL NEXT VISIT)  Joint Injection Care After Refer to this sheet in the next few days. These instructions provide you with information on caring for yourself after you have had a joint injection. Your caregiver also may give you more specific instructions. Your treatment has been planned according to current medical practices, but problems sometimes occur. Call your caregiver if you have any problems or questions after your procedure. After any type of joint injection, it is not uncommon to experience:  Soreness, swelling, or bruising around the injection site.  Mild numbness, tingling, or weakness around the injection site caused by the numbing medicine used before or with the injection. It also is possible to experience the following effects associated with the specific agent after injection:  Iodine-based contrast agents:  Allergic reaction (itching, hives, widespread redness, and swelling beyond the injection site).  Corticosteroids (These effects are rare.):  Allergic reaction.  Increased blood sugar levels (If you have diabetes and you notice that your blood sugar levels have increased, notify your caregiver).  Increased blood pressure levels.  Mood swings.  Hyaluronic acid in the use of viscosupplementation.  Temporary heat or redness.  Temporary rash and itching.  Increased fluid accumulation in the injected joint. These effects all should resolve within a day after your procedure.  HOME CARE INSTRUCTIONS  Limit yourself to light activity the day of your procedure. Avoid lifting heavy objects, bending, stooping, or twisting.  Take prescription or over-the-counter pain medication as directed by your caregiver.  You may apply ice to your injection site to reduce pain and swelling the day of your procedure. Ice may be applied 03-04 times:  Put ice in a plastic bag.  Place a towel between your skin and the bag.  Leave the ice on for no  longer than 15-20 minutes each time. SEEK IMMEDIATE MEDICAL CARE IF:   Pain and swelling get worse rather than better or extend beyond the injection site.  Numbness does not go away.  Blood or fluid continues to leak from the injection site.  You have chest pain.  You have swelling of your face or tongue.  You have trouble breathing or you become dizzy.  You develop a fever, chills, or severe tenderness at the injection site that last longer than 1 day. MAKE SURE YOU:  Understand these instructions.  Watch your condition.  Get help right away if you are not doing well or if you get worse. Document Released: 09/10/2010 Document Revised: 03/22/2011 Document Reviewed: 09/10/2010 [facility] Patient Information 2015 [place], [place]. This information is not intended to replace advice given to you by your health care Javanni Maring. Make sure you discuss any questions you have with your health care Rubye Strohmeyer.   CONTINUE BRACE

## 2013-08-02 ENCOUNTER — Inpatient Hospital Stay (HOSPITAL_COMMUNITY): Admission: RE | Admit: 2013-08-02 | Payer: 59 | Source: Ambulatory Visit

## 2013-08-07 ENCOUNTER — Ambulatory Visit (INDEPENDENT_AMBULATORY_CARE_PROVIDER_SITE_OTHER): Payer: Worker's Compensation | Admitting: Orthopedic Surgery

## 2013-08-07 ENCOUNTER — Encounter: Payer: Self-pay | Admitting: Orthopedic Surgery

## 2013-08-07 DIAGNOSIS — M23329 Other meniscus derangements, posterior horn of medial meniscus, unspecified knee: Secondary | ICD-10-CM

## 2013-08-07 DIAGNOSIS — S8992XD Unspecified injury of left lower leg, subsequent encounter: Secondary | ICD-10-CM

## 2013-08-07 DIAGNOSIS — M23321 Other meniscus derangements, posterior horn of medial meniscus, right knee: Secondary | ICD-10-CM

## 2013-08-07 DIAGNOSIS — Z5189 Encounter for other specified aftercare: Secondary | ICD-10-CM

## 2013-08-07 DIAGNOSIS — Z9889 Other specified postprocedural states: Secondary | ICD-10-CM

## 2013-08-07 DIAGNOSIS — M175 Other unilateral secondary osteoarthritis of knee: Secondary | ICD-10-CM

## 2013-08-07 DIAGNOSIS — S99919A Unspecified injury of unspecified ankle, initial encounter: Secondary | ICD-10-CM

## 2013-08-07 DIAGNOSIS — S99929A Unspecified injury of unspecified foot, initial encounter: Secondary | ICD-10-CM

## 2013-08-07 DIAGNOSIS — S8990XA Unspecified injury of unspecified lower leg, initial encounter: Secondary | ICD-10-CM

## 2013-08-07 NOTE — Patient Instructions (Signed)
Return to normal duty with brace on

## 2013-08-07 NOTE — Progress Notes (Signed)
Patient ID: Brandon Norton, male   DOB: 07-May-1955, 58 y.o.   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 Strength and stability are normal.  MRI showed arthritic changes and that will be chronic.  Right knee please see surgical report but that seems to be giving him the least amount of difficulty  And returned to work with a brace on his left knee this is his final visit

## 2014-01-02 ENCOUNTER — Other Ambulatory Visit: Payer: Self-pay | Admitting: *Deleted

## 2014-01-02 DIAGNOSIS — M175 Other unilateral secondary osteoarthritis of knee: Secondary | ICD-10-CM

## 2014-01-02 MED ORDER — DICLOFENAC POTASSIUM 50 MG PO TABS: 50.0000 mg | ORAL_TABLET | Freq: Two times a day (BID) | ORAL | Status: DC

## 2014-09-03 ENCOUNTER — Other Ambulatory Visit (HOSPITAL_COMMUNITY): Payer: Self-pay | Admitting: Physician Assistant

## 2014-09-03 DIAGNOSIS — C61 Malignant neoplasm of prostate: Secondary | ICD-10-CM

## 2014-09-03 DIAGNOSIS — R1084 Generalized abdominal pain: Secondary | ICD-10-CM

## 2014-09-09 ENCOUNTER — Ambulatory Visit (HOSPITAL_COMMUNITY): Payer: No Typology Code available for payment source

## 2014-12-10 ENCOUNTER — Other Ambulatory Visit: Payer: Self-pay | Admitting: *Deleted

## 2014-12-10 DIAGNOSIS — M175 Other unilateral secondary osteoarthritis of knee: Secondary | ICD-10-CM

## 2014-12-10 MED ORDER — DICLOFENAC POTASSIUM 50 MG PO TABS: 50.0000 mg | ORAL_TABLET | Freq: Two times a day (BID) | ORAL | Status: DC

## 2015-05-25 IMAGING — CR DG KNEE COMPLETE 4+V*L*
4 series · 4 of 4 positions shown · non-contrast
Comparison: None.

CLINICAL DATA: Injury

EXAM:
LEFT KNEE - COMPLETE 4+ VIEW

[view not recorded (1 of 4)]
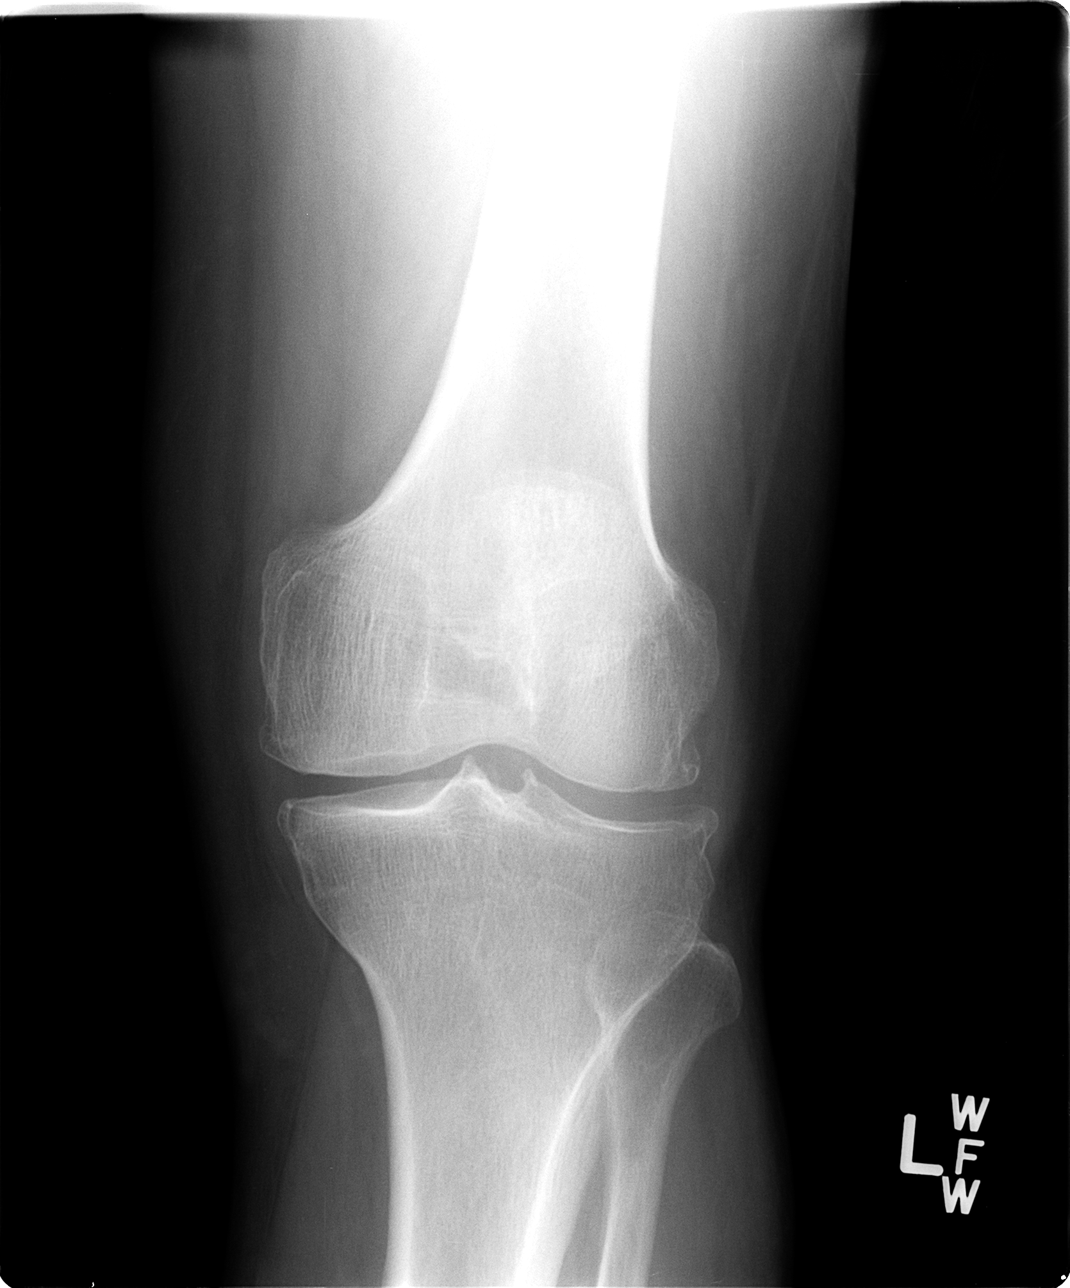

[view not recorded (2 of 4)]
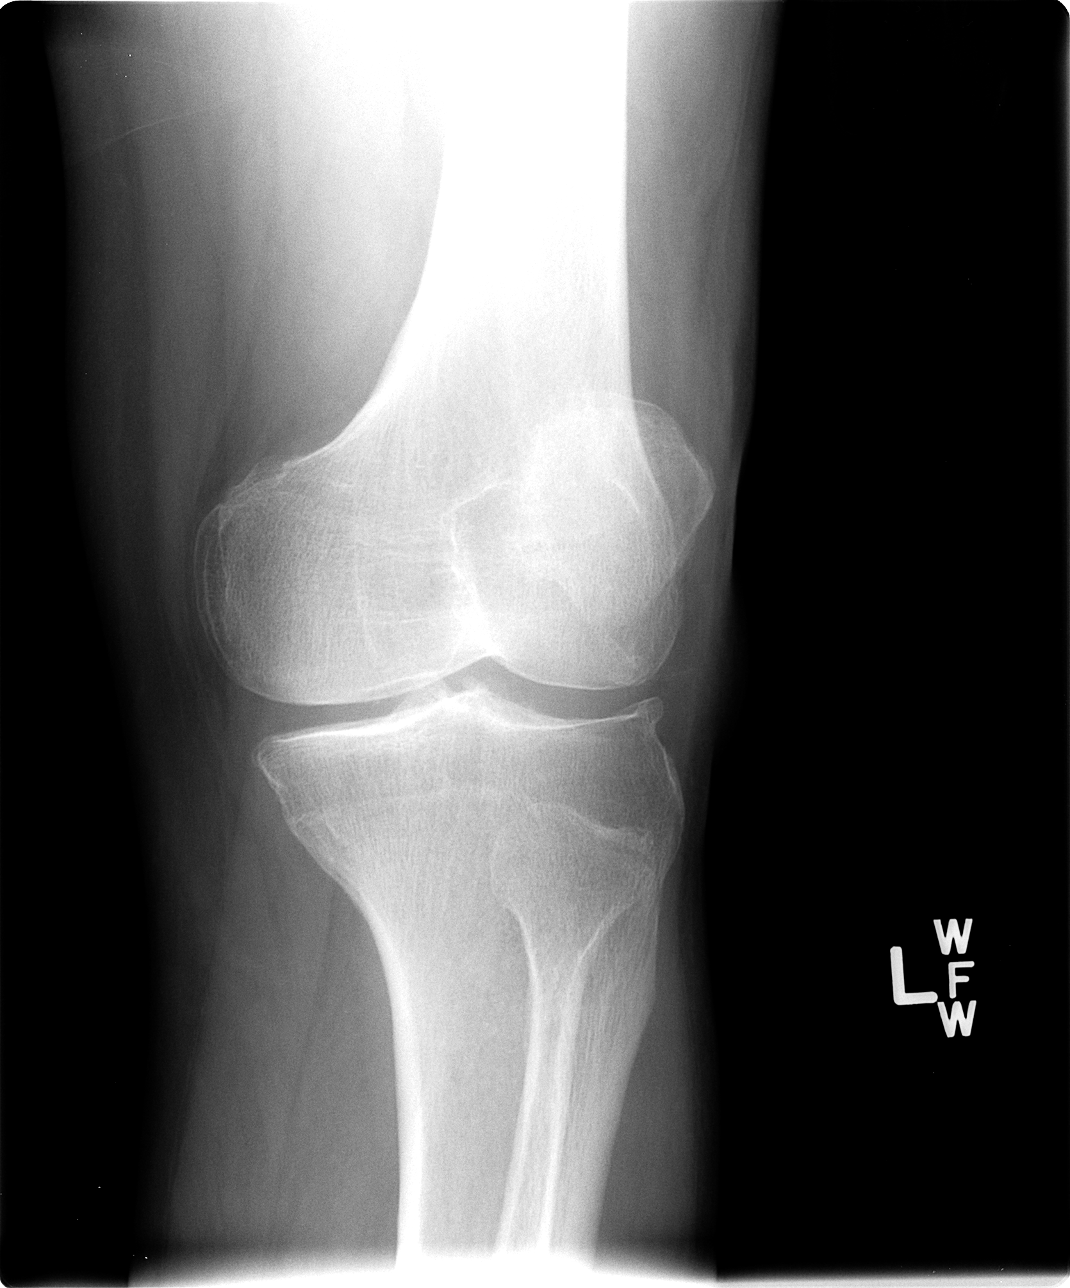

[view not recorded (3 of 4)]
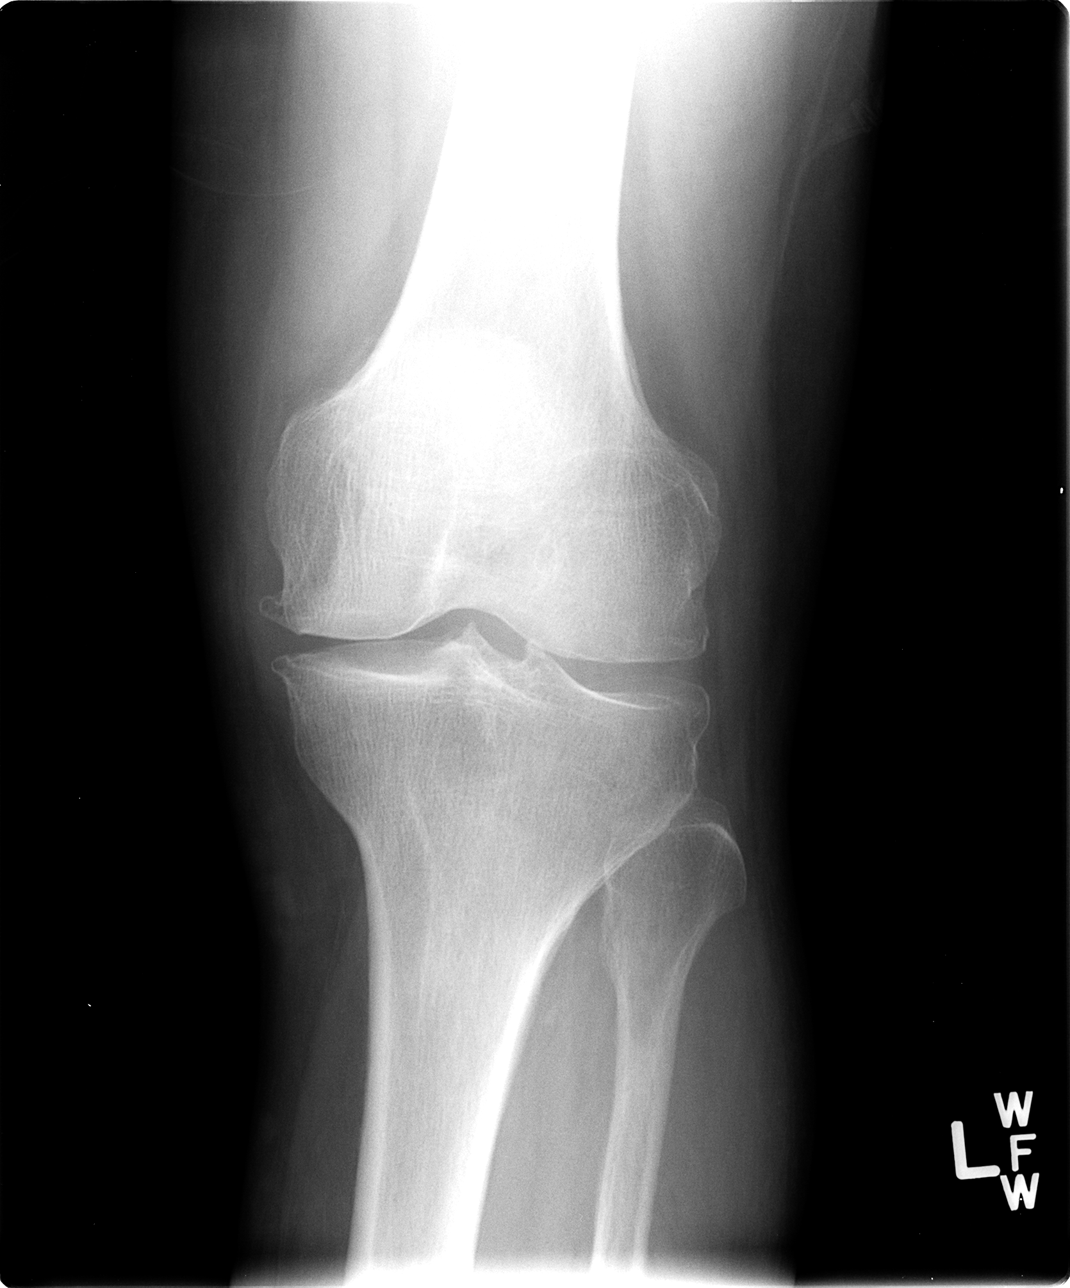

[view not recorded (4 of 4)]
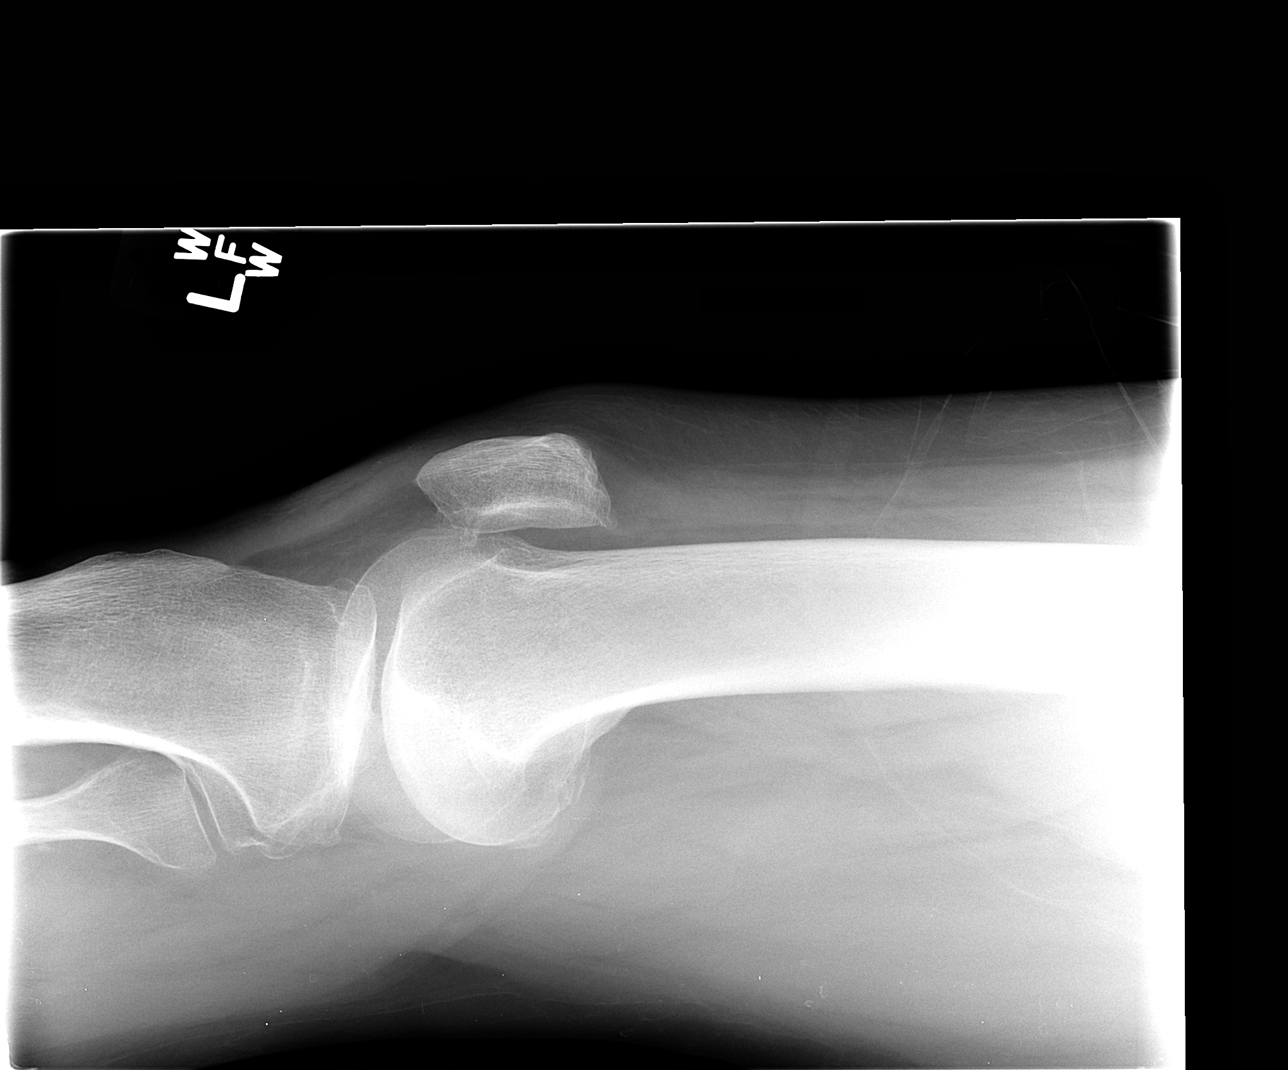

[4 of 4 positions shown; findings below may reference images not displayed]

FINDINGS: Moderate degenerative change.  No acute fracture.  No dislocation.
IMPRESSION: No acute bony pathology.

## 2015-07-14 DIAGNOSIS — K4090 Unilateral inguinal hernia, without obstruction or gangrene, not specified as recurrent: Secondary | ICD-10-CM | POA: Insufficient documentation

## 2017-08-09 ENCOUNTER — Ambulatory Visit: Payer: BLUE CROSS/BLUE SHIELD | Admitting: General Surgery

## 2017-08-09 ENCOUNTER — Encounter: Payer: Self-pay | Admitting: General Surgery

## 2017-08-09 ENCOUNTER — Ambulatory Visit: Payer: Self-pay | Admitting: General Surgery

## 2017-08-09 VITALS — BP 149/88 | HR 61 | Temp 97.3°F | Resp 18 | Wt 183.0 lb

## 2017-08-09 DIAGNOSIS — K8050 Calculus of bile duct without cholangitis or cholecystitis without obstruction: Secondary | ICD-10-CM

## 2017-08-09 NOTE — Progress Notes (Signed)
Brandon Norton; [id number]; March 16, 1955   HPI Patient is a 62 year old Hispanic male who was referred to my care by Dr. Hilma Favors for evaluation treatment of possible gallbladder disease.  Patient has a remote history of cholelithiasis and a pancreatic cyst.  He is also currently being treated for lymphoma.  He states that he has intermittent abdominal pain that is in the right lower half of his abdomen.  It is made worse with spicy foods.  He does have some bloating, but no nausea or vomiting.  He denies any history of right upper quadrant abdominal pain with radiation to the right flank.  He currently has no abdominal pain.  He has followed by oncology at [facility].  He was referred to GI at [facility] in May of this year, but did not make the appointment due to a death in the family.  He is due to see oncology next month.  He stated that he had an ultrasound done recently, but I can find no records of this.  I contacted Dr. Delanna Ahmadi office who could not find any results.  The right lower abdominal pain has been present intermittently for many months. Past Medical History:  Diagnosis Date  . Cancer ([place])   . 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 GERD (gastroesophageal reflux disease)   . Prostate cancer [facility])     Past Surgical History:  Procedure Laterality Date  . KNEE ARTHROSCOPY WITH MEDIAL MENISECTOMY Right 05/18/2013   Procedure: RIGHT KNEE ARTHROSCOPY WITH PARTIAL MEDIAL MENISECTOMY AND MICROFRACTURE LATERAL FEMORAL CONDYLE;  Surgeon: Carole Civil, MD;  Location: [facility];  Service: Orthopedics;  Laterality: Right;  . porta cath      History reviewed. No pertinent family history.  Current Outpatient Medications on File Prior to Visit  Medication Sig Dispense Refill  . enzalutamide (XTANDI) 40 MG capsule Take 160 mg by mouth daily.    Marland Kitchen ibuprofen (MOTRIN IB) 200 MG tablet Take 200 mg by mouth every 6 (six) hours as needed.    . Tamsulosin HCl (FLOMAX) 0.4 MG CAPS  Take 0.4 mg by mouth daily.      No current facility-administered medications on file prior to visit.     No Known Allergies  Social History   Substance and Sexual Activity  Alcohol Use No    Social History   Tobacco Use  Smoking Status Never Smoker  Smokeless Tobacco Never Used    Review of Systems  Constitutional: Negative.   HENT: Negative.   Eyes: Negative.   Respiratory: Negative.   Cardiovascular: Negative.   Gastrointestinal: Positive for abdominal pain and heartburn.  Genitourinary: Negative.   Musculoskeletal: Negative.   Skin: Negative.   Neurological: Negative.   Endo/Heme/Allergies: Negative.   Psychiatric/Behavioral: Negative.     Objective   Vitals:   08/09/17 0855  BP: (!) 149/88  Pulse: 61  Resp: 18  Temp: (!) 97.3 F (36.3 C)    Physical Exam  Constitutional: He is oriented to person, place, and time. He appears well-developed and well-nourished. No distress.  HENT:  Head: Normocephalic and atraumatic.  Cardiovascular: Normal rate, regular rhythm and normal heart sounds. Exam reveals no gallop and no friction rub.  No murmur heard. Pulmonary/Chest: Effort normal and breath sounds normal. No stridor. No respiratory distress. He has no wheezes. He has no rhonchi. He has no rales.  Abdominal: Soft. Normal appearance and bowel sounds are normal. There is no rigidity, no rebound, no guarding and negative Murphy's sign.  Neurological: He is alert and oriented to person, place, and time.  Skin: Skin is warm and dry.  Vitals reviewed. Previous CT and ultrasound reports reviewed.  I could not access notes from [facility].  Assessment  Abdominal pain of unknown etiology.  Patient's symptoms are atypical for biliary colic.  Given his complex history and his history of being referred to GI at [facility], I encouraged him to really make that appointment and to follow-up with his oncologist in August since he has been there for his  care. Plan   Patient understands and agrees with going back to [facility] for further management care.  I told him I be more than happy to take out his gallbladder should that be needed.

## 2017-08-09 NOTE — Patient Instructions (Signed)
Cholelithiasis °Cholelithiasis is a form of gallbladder disease in which gallstones form in the gallbladder. The gallbladder is an organ that stores bile. Bile is made in the liver, and it helps to digest fats. Gallstones begin as small crystals and slowly grow into stones. They may cause no symptoms until the gallbladder tightens (contracts) and a gallstone is blocking the duct (gallbladder attack), which can cause pain. Cholelithiasis is also referred to as gallstones. °There are two main types of gallstones: °· Cholesterol stones. These are made of hardened cholesterol and are usually yellow-green in color. They are the most common type of gallstone. Cholesterol is a white, waxy, fat-like substance that is made in the liver. °· Pigment stones. These are dark in color and are made of a red-yellow substance that forms when hemoglobin from red blood cells breaks down (bilirubin). ° °What are the causes? °This condition may be caused by an imbalance in the substances that bile is made of. This can happen if the bile: °· Has too much bilirubin. °· Has too much cholesterol. °· Does not have enough bile salts. These salts help the body absorb and digest fats. ° °In some cases, this condition can also be caused by the gallbladder not emptying completely or often enough. °What increases the risk? °The following factors may make you more likely to develop this condition: °· Being male. °· Having multiple pregnancies. Health care providers sometimes advise removing diseased gallbladders before future pregnancies. °· Eating a diet that is heavy in fried foods, fat, and refined carbohydrates, like white bread and white rice. °· Being obese. °· Being older than age 40. °· Prolonged use of medicines that contain male hormones (estrogen). °· Having diabetes mellitus. °· Rapidly losing weight. °· Having a family history of gallstones. °· Being of American Indian or Mexican descent. °· Having an intestinal disease such as  Crohn disease. °· Having metabolic syndrome. °· Having cirrhosis. °· Having severe types of anemia such as sickle cell anemia. ° °What are the signs or symptoms? °In most cases, there are no symptoms. These are known as silent gallstones. If a gallstone blocks the bile ducts, it can cause a gallbladder attack. The main symptom of a gallbladder attack is sudden pain in the upper right abdomen. The pain usually comes at night or after eating a large meal. The pain can last for one or several hours and can spread to the right shoulder or chest. °If the bile duct is blocked for more than a few hours, it can cause infection or inflammation of the gallbladder, liver, or pancreas, which may cause: °· Nausea. °· Vomiting. °· Abdominal pain that lasts for 5 hours or more. °· Fever or chills. °· Yellowing of the skin or the whites of the eyes (jaundice). °· Dark urine. °· Light-colored stools. ° °How is this diagnosed? °This condition may be diagnosed based on: °· A physical exam. °· Your medical history. °· An ultrasound of your gallbladder. °· CT scan. °· MRI. °· Blood tests to check for signs of infection or inflammation. °· A scan of your gallbladder and bile ducts (biliary system) using nonharmful radioactive material and special cameras that can see the radioactive material (cholescintigram). This test checks to see how your gallbladder contracts and whether bile ducts are blocked. °· Inserting a small tube with a camera on the end (endoscope) through your mouth to inspect bile ducts and check for blockages (endoscopic retrograde cholangiopancreatogram). ° °How is this treated? °Treatment for gallstones depends on the   severity of the condition. Silent gallstones do not need treatment. If the gallstones cause a gallbladder attack or other symptoms, treatment may be required. Options for treatment include: °· Surgery to remove the gallbladder (cholecystectomy). This is the most common treatment. °· Medicines to dissolve  gallstones. These are most effective at treating small gallstones. You may need to take medicines for up to 6-12 months. °· Shock wave treatment (extracorporeal biliary lithotripsy). In this treatment, an ultrasound machine sends shock waves to the gallbladder to break gallstones into smaller pieces. These pieces can then be passed into the intestines or be dissolved by medicine. This is rarely used. °· Removing gallstones through endoscopic retrograde cholangiopancreatogram. A small basket can be attached to the endoscope and used to capture and remove gallstones. ° °Follow these instructions at home: °· Take over-the-counter and prescription medicines only as told by your health care provider. °· Maintain a healthy weight and follow a healthy diet. This includes: °? Reducing fatty foods, such as fried food. °? Reducing refined carbohydrates, like white bread and white rice. °? Increasing fiber. Aim for foods like almonds, fruit, and beans. °· Keep all follow-up visits as told by your health care provider. This is important. °Contact a health care provider if: °· You think you have had a gallbladder attack. °· You have been diagnosed with silent gallstones and you develop abdominal pain or indigestion. °Get help right away if: °· You have pain from a gallbladder attack that lasts for more than 2 hours. °· You have abdominal pain that lasts for more than 5 hours. °· You have a fever or chills. °· You have persistent nausea and vomiting. °· You develop jaundice. °· You have dark urine or light-colored stools. °Summary °· Cholelithiasis (also called gallstones) is a form of gallbladder disease in which gallstones form in the gallbladder. °· This condition is caused by an imbalance in the substances that make up bile. This can happen if the bile has too much cholesterol, too much bilirubin, or not enough bile salts. °· You are more likely to develop this condition if you are male, pregnant, using medicines with  estrogen, obese, older than age 40, or have a family history of gallstones. You may also develop gallstones if you have diabetes, an intestinal disease, cirrhosis, or metabolic syndrome. °· Treatment for gallstones depends on the severity of the condition. Silent gallstones do not need treatment. °· If gallstones cause a gallbladder attack or other symptoms, treatment may be needed. The most common treatment is surgery to remove the gallbladder. °This information is not intended to replace advice given to you by your health care provider. Make sure you discuss any questions you have with your health care provider. °Document Released: 12/24/2004 Document Revised: 09/14/2015 Document Reviewed: 09/14/2015 °Elsevier Interactive Patient Education © 2018 [employer] Inc[employer] ° °

## 2018-01-24 ENCOUNTER — Encounter: Payer: Self-pay | Admitting: General Surgery

## 2018-01-24 ENCOUNTER — Ambulatory Visit: Payer: BLUE CROSS/BLUE SHIELD | Admitting: General Surgery

## 2018-01-24 VITALS — BP 148/92 | HR 52 | Temp 97.1°F | Resp 16 | Wt 184.2 lb

## 2018-01-24 DIAGNOSIS — K439 Ventral hernia without obstruction or gangrene: Secondary | ICD-10-CM | POA: Diagnosis not present

## 2018-01-24 MED ORDER — PEG 3350-KCL-NABCB-NACL-NASULF 236 G PO SOLR: 4000.0000 mL | Freq: Once | ORAL | 0 refills | Status: AC

## 2018-01-24 NOTE — Progress Notes (Signed)
Brandon Norton; [id number]; Oct 14, 1955   HPI Patient is a 63 year old Hispanic male who was referred back to my care by Dr. Sharilyn Sites for evaluation treatment of a right spigelian hernia.  This was found on CT scan done at [facility] for right lower quadrant abdominal pain.  He does have a history of lymphoma.  He states the pain is made worse with straining.  He describes the pain as 8 out of 10 when it is present.  Currently has minimal abdominal pain.  He denies any nausea or vomiting.  He has had inguinal surgery in the remote past in that region.  He is also referred for a screening colonoscopy.  He had a colonoscopy in the remote past.  He denies any family history of colon cancer. Past Medical History:  Diagnosis Date  . Cancer ([place])   . 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 GERD (gastroesophageal reflux disease)   . Prostate cancer [facility])     Past Surgical History:  Procedure Laterality Date  . KNEE ARTHROSCOPY WITH MEDIAL MENISECTOMY Right 05/18/2013   Procedure: RIGHT KNEE ARTHROSCOPY WITH PARTIAL MEDIAL MENISECTOMY AND MICROFRACTURE LATERAL FEMORAL CONDYLE;  Surgeon: Carole Civil, MD;  Location: [facility];  Service: Orthopedics;  Laterality: Right;  . porta cath      History reviewed. No pertinent family history.  Current Outpatient Medications on File Prior to Visit  Medication Sig Dispense Refill  . enzalutamide (XTANDI) 40 MG capsule Take 160 mg by mouth daily.    Marland Kitchen ibuprofen (MOTRIN IB) 200 MG tablet Take 200 mg by mouth every 6 (six) hours as needed.    . Tamsulosin HCl (FLOMAX) 0.4 MG CAPS Take 0.4 mg by mouth daily.      No current facility-administered medications on file prior to visit.     No Known Allergies  Social History   Substance and Sexual Activity  Alcohol Use No    Social History   Tobacco Use  Smoking Status Never Smoker  Smokeless Tobacco Never Used    Review of Systems  Constitutional: Negative.    HENT: Negative.   Eyes: Negative.   Respiratory: Negative.   Cardiovascular: Negative.   Gastrointestinal: Positive for abdominal pain.  Genitourinary: Negative.   Musculoskeletal: Negative.   Skin: Negative.   Neurological: Negative.   Endo/Heme/Allergies: Negative.   Psychiatric/Behavioral: Negative.     Objective   Vitals:   01/24/18 1315  BP: (!) 148/92  Pulse: (!) 52  Resp: 16  Temp: (!) 97.1 F (36.2 C)    Physical Exam Vitals signs reviewed.  Constitutional:      Appearance: Normal appearance. He is not ill-appearing.  HENT:     Head: Normocephalic and atraumatic.  Cardiovascular:     Rate and Rhythm: Normal rate and regular rhythm.     Heart sounds: No murmur. No gallop.   Pulmonary:     Effort: Pulmonary effort is normal. No respiratory distress.     Breath sounds: Normal breath sounds. No stridor. No wheezing, rhonchi or rales.  Abdominal:     General: Abdomen is flat. Bowel sounds are normal. There is no distension.     Palpations: Abdomen is soft. There is no mass.     Tenderness: There is no abdominal tenderness. There is no guarding or rebound.     Hernia: A hernia is present.     Comments: Small easily reducible right spigelian hernia present.  Skin:    General: Skin  is warm and dry.  Neurological:     Mental Status: He is alert and oriented to person, place, and time.    CT scan report from [facility] reviewed Assessment  Right spigelian hernia Need for screening colonoscopy Plan   Patient is scheduled for screening colonoscopy on 02/07/2018.  The risks and benefits of the procedure including bleeding and perforation were fully explained to the patient, who gave informed consent.  GoLYTELY prep has been prescribed.  He will subsequently undergo a ventral herniorrhaphy with mesh on 02/10/2018.  The risks and benefits of the procedure including bleeding, infection, mesh use, and the possibility of recurrence of the hernia were fully  explained to the patient, who gave informed consent.

## 2018-01-24 NOTE — H&P (Signed)
Brandon Norton [place]; [id number]; 09/02/1955   HPI Patient is a 63 year old Hispanic male who was referred back to my care by Dr. Sharilyn Sites for evaluation treatment of a right spigelian hernia.  This was found on CT scan done at [facility] for right lower quadrant abdominal pain.  He does have a history of lymphoma.  He states the pain is made worse with straining.  He describes the pain as 8 out of 10 when it is present.  Currently has minimal abdominal pain.  He denies any nausea or vomiting.  He has had inguinal surgery in the remote past in that region.  He is also referred for a screening colonoscopy.  He had a colonoscopy in the remote past.  He denies any family history of colon cancer. Past Medical History:  Diagnosis Date  . Cancer ([place])   . 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 GERD (gastroesophageal reflux disease)   . Prostate cancer [facility])     Past Surgical History:  Procedure Laterality Date  . KNEE ARTHROSCOPY WITH MEDIAL MENISECTOMY Right 05/18/2013   Procedure: RIGHT KNEE ARTHROSCOPY WITH PARTIAL MEDIAL MENISECTOMY AND MICROFRACTURE LATERAL FEMORAL CONDYLE;  Surgeon: Carole Civil, MD;  Location: [facility];  Service: Orthopedics;  Laterality: Right;  . porta cath      History reviewed. No pertinent family history.  Current Outpatient Medications on File Prior to Visit  Medication Sig Dispense Refill  . enzalutamide (XTANDI) 40 MG capsule Take 160 mg by mouth daily.    Marland Kitchen ibuprofen (MOTRIN IB) 200 MG tablet Take 200 mg by mouth every 6 (six) hours as needed.    . Tamsulosin HCl (FLOMAX) 0.4 MG CAPS Take 0.4 mg by mouth daily.      No current facility-administered medications on file prior to visit.     No Known Allergies  Social History   Substance and Sexual Activity  Alcohol Use No    Social History   Tobacco Use  Smoking Status Never Smoker  Smokeless Tobacco Never Used    Review of Systems  Constitutional: Negative.    HENT: Negative.   Eyes: Negative.   Respiratory: Negative.   Cardiovascular: Negative.   Gastrointestinal: Positive for abdominal pain.  Genitourinary: Negative.   Musculoskeletal: Negative.   Skin: Negative.   Neurological: Negative.   Endo/Heme/Allergies: Negative.   Psychiatric/Behavioral: Negative.     Objective   Vitals:   01/24/18 1315  BP: (!) 148/92  Pulse: (!) 52  Resp: 16  Temp: (!) 97.1 F (36.2 C)    Physical Exam Vitals signs reviewed.  Constitutional:      Appearance: Normal appearance. He is not ill-appearing.  HENT:     Head: Normocephalic and atraumatic.  Cardiovascular:     Rate and Rhythm: Normal rate and regular rhythm.     Heart sounds: No murmur. No gallop.   Pulmonary:     Effort: Pulmonary effort is normal. No respiratory distress.     Breath sounds: Normal breath sounds. No stridor. No wheezing, rhonchi or rales.  Abdominal:     General: Abdomen is flat. Bowel sounds are normal. There is no distension.     Palpations: Abdomen is soft. There is no mass.     Tenderness: There is no abdominal tenderness. There is no guarding or rebound.     Hernia: A hernia is present.     Comments: Small easily reducible right spigelian hernia present.  Skin:    General: Skin  is warm and dry.  Neurological:     Mental Status: He is alert and oriented to person, place, and time.    CT scan report from [facility] reviewed Assessment  Right spigelian hernia Need for screening colonoscopy Plan   Patient is scheduled for screening colonoscopy on 02/07/2018.  The risks and benefits of the procedure including bleeding and perforation were fully explained to the patient, who gave informed consent.  GoLYTELY prep has been prescribed.  He will subsequently undergo a ventral herniorrhaphy with mesh on 02/10/2018.  The risks and benefits of the procedure including bleeding, infection, mesh use, and the possibility of recurrence of the hernia were fully  explained to the patient, who gave informed consent.

## 2018-01-24 NOTE — H&P (Signed)
Brandon Norton [place]; [id number]; 1955-09-30   HPI Patient is a 63 year old Hispanic male who was referred back to my care by Dr. Sharilyn Sites for evaluation treatment of a right spigelian hernia.  This was found on CT scan done at [facility] for right lower quadrant abdominal pain.  He does have a history of lymphoma.  He states the pain is made worse with straining.  He describes the pain as 8 out of 10 when it is present.  Currently has minimal abdominal pain.  He denies any nausea or vomiting.  He has had inguinal surgery in the remote past in that region.  He is also referred for a screening colonoscopy.  He had a colonoscopy in the remote past.  He denies any family history of colon cancer. Past Medical History:  Diagnosis Date  . Cancer ([place])   . 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 GERD (gastroesophageal reflux disease)   . Prostate cancer [facility])     Past Surgical History:  Procedure Laterality Date  . KNEE ARTHROSCOPY WITH MEDIAL MENISECTOMY Right 05/18/2013   Procedure: RIGHT KNEE ARTHROSCOPY WITH PARTIAL MEDIAL MENISECTOMY AND MICROFRACTURE LATERAL FEMORAL CONDYLE;  Surgeon: Carole Civil, MD;  Location: [facility];  Service: Orthopedics;  Laterality: Right;  . porta cath      History reviewed. No pertinent family history.  Current Outpatient Medications on File Prior to Visit  Medication Sig Dispense Refill  . enzalutamide (XTANDI) 40 MG capsule Take 160 mg by mouth daily.    Marland Kitchen ibuprofen (MOTRIN IB) 200 MG tablet Take 200 mg by mouth every 6 (six) hours as needed.    . Tamsulosin HCl (FLOMAX) 0.4 MG CAPS Take 0.4 mg by mouth daily.      No current facility-administered medications on file prior to visit.     No Known Allergies  Social History   Substance and Sexual Activity  Alcohol Use No    Social History   Tobacco Use  Smoking Status Never Smoker  Smokeless Tobacco Never Used    Review of Systems  Constitutional: Negative.    HENT: Negative.   Eyes: Negative.   Respiratory: Negative.   Cardiovascular: Negative.   Gastrointestinal: Positive for abdominal pain.  Genitourinary: Negative.   Musculoskeletal: Negative.   Skin: Negative.   Neurological: Negative.   Endo/Heme/Allergies: Negative.   Psychiatric/Behavioral: Negative.     Objective   Vitals:   01/24/18 1315  BP: (!) 148/92  Pulse: (!) 52  Resp: 16  Temp: (!) 97.1 F (36.2 C)    Physical Exam Vitals signs reviewed.  Constitutional:      Appearance: Normal appearance. He is not ill-appearing.  HENT:     Head: Normocephalic and atraumatic.  Cardiovascular:     Rate and Rhythm: Normal rate and regular rhythm.     Heart sounds: No murmur. No gallop.   Pulmonary:     Effort: Pulmonary effort is normal. No respiratory distress.     Breath sounds: Normal breath sounds. No stridor. No wheezing, rhonchi or rales.  Abdominal:     General: Abdomen is flat. Bowel sounds are normal. There is no distension.     Palpations: Abdomen is soft. There is no mass.     Tenderness: There is no abdominal tenderness. There is no guarding or rebound.     Hernia: A hernia is present.     Comments: Small easily reducible right spigelian hernia present.  Skin:    General: Skin  is warm and dry.  Neurological:     Mental Status: He is alert and oriented to person, place, and time.    CT scan report from [facility] reviewed Assessment  Right spigelian hernia Need for screening colonoscopy Plan   Patient is scheduled for screening colonoscopy on 02/07/2018.  The risks and benefits of the procedure including bleeding and perforation were fully explained to the patient, who gave informed consent.  GoLYTELY prep has been prescribed.  He will subsequently undergo a ventral herniorrhaphy with mesh on 02/10/2018.  The risks and benefits of the procedure including bleeding, infection, mesh use, and the possibility of recurrence of the hernia were fully  explained to the patient, who gave informed consent.

## 2018-02-06 NOTE — Patient Instructions (Signed)
Instrucciones Para Antes de la Ciruga   Su ciruga est programada para-(your procedure is scheduled on)  02/10/2018   Brandon Norton -     Por favor llame al [phone] si tiene algn problema en la maana de la ciruga. (please call if you have any problems the morning of surgery.)                  Recuerde: (Remember)   No coma alimentos ni tome lquidos, incluyendo agua, despus de la medianoche del  (Do not eat food or drink liquids including water after midnight on 02/09/2018 Tome estas medicinas en la maana de la ciruga con un SORBITO de agua (take these meds the morning of surgery with a SIP of water)  flomax.   Puede cepillarse los dientes en la maana de la [place]. (you may brush your teeth the morning of surgery)   No use joyas, maquillaje de ojos, lpiz labial, crema para el cuerpo o esmalte de uas oscuro. (Do not wear jewelry, eye makeup, lipstick, body lotion, or dark fingernail polish)   No puede usar desodorante. (you may wear deodorant)   Si va a ser ingresado despues de la ciruga, deje la maleta en el carro hasta que se le haya asignado una habitacin. (If you are to be admitted after surgery, leave suitcase in car until your room has been assigned.)   A los pacientes que se les d de alta el mismo da no se les permitir manejar a casa.  (Patients discharged on the day of surgery will not be allowed to drive home)   Use ropa suelta y cmoda de regreso a casa. (wear loose comfortable clothes for ride home)    [place] (patient signature)  Reparacin abierta de una hernia, en adultos Open Hernia Repair, Adult  La reparacin abierta de una hernia es un procedimiento quirrgico que se realiza para reparar una hernia. Una hernia ocurre cuando un rgano o tejido interno protruye a travs de un punto dbil de los msculos de la pared abdominal. Las hernias se producen con mayor frecuencia en  la ingle y alrededor del ombligo. La mayor parte de las hernias tienden a Copy con Physiological scientist. Generalmente, se realiza una ciruga para evitar que la hernia se agrande, se torne molesta o se Radiation protection practitioner. Es posible que sea necesaria una ciruga de emergencia si el contenido abdominal queda atascado en la abertura (hernia encarcelada) o se interrumpe el suministro de sangre (hernia estrangulada). En una reparacin abierta, se realiza una incisin en el abdomen para poder llevar a cabo la ciruga. Informe al mdico acerca de lo siguiente:  Cualquier alergia que tenga.  Todos los [employer], incluidos las vitaminas, hierbas, gotas oftlmicas, cremas y medicamentos de venta libre.  Cualquier problema previo que usted o algn miembro de su familia haya tenido con los anestsicos.  Cualquier enfermedad de la sangre o los huesos que tenga.  Cirugas previas.  Cualquier afeccin mdica que tenga, incluido cualquier resfro o sntoma de gripe recientes.  Si est embarazada o podra estarlo. Cules son los riesgos? En general, se trata de un procedimiento seguro. Sin embargo, pueden ocurrir complicaciones, por ejemplo:  Tener dolor por mucho tiempo (crnico).  Una hemorragia.  Una infeccin.  Dao [employer]. Esto puede causar encogimiento o hinchazn.  Dao en la vejiga, los vasos sanguneos, el intestino o los nervios cerca de la hernia.  Dificultad para orinar.  Reacciones alrgicas a los medicamentos.  Recurrencia de la hernia. Qu ocurre antes del procedimiento? Debe mantenerse hidratado Siga las indicaciones del mdico para mantenerse hidratado, las cuales pueden incluir lo siguiente:  Hasta 2horas antes del procedimiento, puede beber lquidos transparentes, como agua, jugos de frutas transparentes, caf negro y t solo. Restricciones en lo que puede comer y beber Siga las indicaciones del mdico respecto de lo que puede comer y beber,  las cuales pueden incluir lo siguiente:  8 horas antes del procedimiento, no coma alimentos pesados, por ejemplo, carnes, alimentos con alto contenido graso o fritos.  6 horas antes del procedimiento, deje de comer incluso alimentos livianos, como tostadas o cereales.  6 horas antes del procedimiento, deje de beber [place] o bebidas que [employer].  2 horas antes del procedimiento, deje de beber lquidos transparentes. Medicamentos  Consulte al mdico si debe hacer o no lo siguiente: ? Cambiar o suspender los medicamentos que toma habitualmente. Esto es muy importante si toma medicamentos para la diabetes o anticoagulantes. ? Tomar medicamentos como aspirina e ibuprofeno. Estos medicamentos pueden tener un efecto anticoagulante en la [place]. No tome estos medicamentos antes del procedimiento si su mdico le indica que no lo haga.  Pueden darle antibiticos para ayudar a prevenir infecciones. Instrucciones generales  Es posible que deba realizarse anlisis de sangre o estudios de diagnstico por imgenes.  Pregntele al mdico cmo se Scientist, clinical (histocompatibility and immunogenetics) o se Museum/gallery curator de la Leisure centre manager.  Si fuma, no fume durante al menos 2 semanas antes del procedimiento o por el tiempo que le haya indicado su mdico.  Infrmele al mdico antes de la ciruga si se ha resfriado o si tiene una infeccin.  Haga planes para que una persona lo lleve a su casa desde el hospital o la clnica.  Si se ir a su casa inmediatamente despus del procedimiento, planifique que alguien se quede con usted durante 24horas. Qu ocurre durante el procedimiento?  Para reducir el riesgo de infecciones: ? El equipo mdico se lavar o se desinfectar las manos. ? Le lavarn la piel con jabn. ? Pueden rasurarle la zona [place].  Le colocarn un tubo (catter) intravenoso en una de las venas.  Le administrarn uno o ms de los siguientes medicamentos: ? Un medicamento para ayudarlo a relajarse (sedante). ? Un  medicamento para adormecer la zona (anestesia local). ? Un medicamento para hacerlo dormir (anestesia general).  Su cirujano le realizar una incisin en la hernia.  Los tejidos de la hernia volvern a Chief of Staff.  Los bordes de la hernia se pueden unir con puntos.  La abertura de los msculos abdominales se cerrar con puntos (suturas). O el cirujano puede colocar un parche de [place] de material artificial (sinttico) sobre la abertura.  Se cerrar la incisin.  Pueden colocarle una venda (vendaje) sobre TEFL teacher de la incisin. Este procedimiento puede variar segn el mdico y el hospital. Sander Nephew sucede despus del procedimiento?  Controlarn su presin arterial, frecuencia cardaca, frecuencia respiratoria y el nivel de oxgeno en la sangre hasta que haya desaparecido el efecto de los medicamentos administrados.  Pueden administrarle analgsicos.  No conduzca durante 24horas si le administraron un sedante. Esta informacin no tiene Marine scientist el consejo del mdico.  Asegrese de hacerle al mdico cualquier pregunta que tenga. Document Released: 12/28/2004 Document Revised: 04/02/2016 Document Reviewed: 06/11/2015 Elsevier Interactive Patient Education  2019 [place] de una hernia en los adultos, cuidados posteriores Open Hernia Repair, Adult, Care After Estas indicaciones le proporcionan informacin acerca de cmo deber cuidarse despus del procedimiento. El mdico tambin podr darle indicaciones ms especficas. Si tiene problemas o preguntas, llame al mdico. Siga estas indicaciones en su casa: Cuidado del corte quirrgico (incisin)   Siga las indicaciones del mdico en lo que respecta al cuidado de la zona del corte quirrgico. [place] lo siguiente: ? Lvese las manos con agua y jabn antes de Quarry manager las vendas (vendaje). Use un desinfectante para manos si no dispone de [place] y [place]. ? Cambie el vendaje como se lo haya indicado el mdico. ? No  retire los puntos (suturas), la goma para cerrar la piel o las tiras [place]. Tal vez deban dejarse puestos en la piel durante 2semanas o ms tiempo. Si las tiras [place] se despegan y se enroscan, puede recortar los bordes sueltos. No retire las tiras [facility] por completo a menos que el mdico lo autorice.  Controle el corte quirrgico todos los das para detectar signos de infeccin. Est atento a los siguientes signos: ? Aumento del enrojecimiento, de la hinchazn o del dolor. ? Ms lquido Delorise Shiner. ? Calor. ? Pus o mal olor. Actividad  No conduzca ni use maquinaria pesada mientras toma analgsicos recetados. No conduzca hasta que el mdico lo autorice.  Hasta que su mdico lo autorice: ? No levante ningn objeto que pese ms de 10libras (4,5kg). ? No practique deportes de contacto.  Retome sus actividades habituales como se lo haya indicado el mdico. Pregntele al mdico qu actividades son seguras para usted. Instrucciones generales  A fin de prevenir o tratar el estreimiento mientras toma analgsicos recetados, el mdico puede recomendarle lo siguiente: ? Beber suficiente lquido para mantener el pis (orina) claro o de color amarillo plido. ? Tomar medicamentos recetados o de [employer]. ? Consumir alimentos ricos en fibra, como frutas y verduras frescas, cereales integrales y frijoles. ? Limitar el consumo de alimentos con alto contenido de grasas y azcares procesados, como alimentos fritos o dulces.  Tome los medicamentos de venta libre y los recetados solamente como se lo haya indicado el mdico.  No tome baos de inmersin, no practique natacin ni use el jacuzzi hasta que el mdico lo autorice.  Concurra a todas las visitas de control como se lo haya indicado el mdico. Esto es importante. Comunquese con un mdico si:  Le aparece una erupcin cutnea.  Tiene ms enrojecimiento, hinchazn o dolor alrededor del corte quirrgico.  Aumenta la cantidad de  lquido o sangre que sale del corte.  El corte est caliente al tacto.  Tiene pus o percibe mal olor que emana del corte quirrgico.  Tiene fiebre o siente escalofros.  Observa sangre en las heces (materia fecal).  No ha defecado en 2 o 3das.  Los medicamentos no Forensic psychologist. Solicite ayuda de inmediato si:  Electronics engineer o le falta el aire.  Tiene sensacin de desvanecimiento.  Se siente dbil y mareado (sensacin de desvanecimiento).  Siente Geophysical data processor.  Vomita y Printmaker. Esta informacin no tiene Marine scientist el consejo del mdico. Asegrese de hacerle al mdico cualquier pregunta que tenga. Document Released: 01/18/2014 Document Revised: 04/02/2016 Document Reviewed: 06/11/2015 Elsevier Interactive Patient Education  2019 [employer].  Anestesia general, en adultos General Anesthesia, Adult La anestesia general es el uso de medicamentos para dormir a Furniture conservator/restorer persona (dejarla inconsciente) para un procedimiento mdico. La anestesia general se debe usar para ciertos procedimientos y normalmente se recomienda para procedimientos que:  Theatre stage manager.  Requieren que se quede quieto o que est en una posicin inusual.  Son importantes y pueden producir prdida de Biochemist, clinical. Los medicamentos utilizados para la anestesia general se llaman anestsicos generales. Adems de dejarlo inconsciente durante una cierta cantidad de [place], estos medicamentos:  Teacher, music.  Controlan su presin arterial.  Relajan los msculos. Informe al mdico acerca de lo siguiente:  Cualquier alergia que tenga.  Todos los [employer], incluidos vitaminas, hierbas, gotas oftlmicas, cremas y medicamentos de venta libre.  Cualquier problema previo que usted o algn miembro de su familia haya tenido con los anestsicos.  Tipos de anestsicos que le hayan administrado en el pasado.  Cualquier trastorno de la sangre que tenga.  Cirugas a  las que se haya sometido.  Cualquier afeccin mdica que tenga.  Infecciones recientes en las vas respiratorias altas, el pecho o el odo.  Antecedentes de: ? Afecciones cardacas o pulmonares, como insuficiencia cardaca, apnea del sueo, asma o enfermedad pulmonar obstructiva crnica (EPOC). ? Airline pilot. ? Depresin o ansiedad.  Cualquier consumo de tabaco o drogas ilegales, lo que incluye consumo de marihuana o alcohol.  Si est embarazada o podra estarlo. Cules son los riesgos? En general, se trata de un procedimiento seguro. Sin embargo, pueden ocurrir complicaciones, por ejemplo:  Risk analyst.  Problemas cardacos o pulmonares.  Inhalacin de alimentos o lquidos del estmago a los pulmones (aspiracin).  Lesiones en los nervios.  Lesin [employer].  Aire en el torrente sanguneo, que puede provocar un accidente cerebrovascular.  Nerviosismo o confusin extremos (delirio) al despertarse de la anestesia.  Despertarse durante su procedimiento y no poder moverse. Esto es poco frecuente. Es ms probable que Magazine features editor en caso de que le realicen una ciruga mayor o si tiene una afeccin mdica avanzada o grave. Puede evitar algunas de estas complicaciones respondiendo todas las preguntas del mdico meticulosamente y siguiendo todas las indicaciones que le brinden antes del procedimiento. La anestesia general puede causar efectos secundarios, como por ejemplo:  Nuseas o vmitos.  Dolor de garganta causado por el tubo respiratorio.  Ronquera.  Tos o sibilancias.  Escalofros.  Cansancio.  Dolores [employer] cuerpo.  Ansiedad.  Sueo o somnolencia.  Confusin o agitacin. Qu ocurre antes del procedimiento? Cmo mantenerse hidratado Siga las indicaciones del mdico acerca de mantenerse hidratado, las cuales pueden incluir lo siguiente:  [employer] horas antes del procedimiento, puede beber lquidos claros, como agua, jugos de fruta  sin pulpa, caf negro y t solo.  Restricciones en las comidas y bebidas Siga las instrucciones del mdico respecto de las restricciones de comidas o bebidas, las cuales pueden incluir lo siguiente:  Ocho horas antes del procedimiento, deje de ingerir comidas o alimentos pesados, por ejemplo, carne, alimentos fritos o alimentos grasos.  Seis horas antes del procedimiento, deje de ingerir comidas o alimentos livianos, como tostadas o cereales.  Seis horas antes del procedimiento, deje de tomar [place].  Dos horas antes del procedimiento, deje de beber lquidos claros. Medicamentos Consulte al mdico sobre:  Quarry manager o suspender los medicamentos que toma habitualmente. Esto es muy importante si toma medicamentos para la diabetes o anticoagulantes.  Tomar medicamentos como aspirina e ibuprofeno.  Estos medicamentos pueden tener un efecto anticoagulante en la [place]. No tome estos medicamentos a menos que el mdico se lo indique.  Tomar medicamentos de [employer], vitaminas, hierbas y suplementos. No tome estos medicamentos durante la semana anterior a la ciruga, a menos que el mdico lo autorice. Indicaciones generales  A partir de 3 a 6 semanas antes del procedimiento, no consuma ningn producto que contenga tabaco o nicotina, como cigarrillos y Psychologist, sport and exercise. Si necesita ayuda para dejar de fumar, consulte al mdico.  Si se lava los dientes la maana del procedimiento, asegrese de escupir toda la pasta de dientes.  Informe al mdico si se enferma o si se resfra, tiene tos o fiebre.  Si se lo indica el mdico, lleve el dispositivo para la apnea del sueo con usted el da de la ciruga (si corresponde).  Pregntele al mdico si volver a su casa el [place] siguiente, o si debe quedarse en el hospital por [employer]. ? Haga que alguien lo lleve a su casa desde el hospital o la clnica. ? Haga que un adulto responsable lo cuide durante al  menos 24horas despus de que le den el alta del hospital o de la [place]. Esto es importante. Qu ocurre durante el procedimiento?   Se le administrar la anestesia a travs lo siguiente: ? Judene Companion colocada sobre su nariz y boca. ? Una va intravenosa en una de las venas.  Pueden administrarle un medicamento para que se relaje (sedante).  Una vez que est inconsciente, se le insertar un tubo respiratorio en la garganta para ayudarlo a respirar. Este se quitar antes de que despierte.  Un anestesista permanecer con usted durante todo el procedimiento. El mdico: ? Continuar administrndole medicamentos y ajustando la dosis para mantenerlo cmodo y Engineer, maintenance. ? Controlar la presin arterial, el pulso y los niveles de oxgeno para asegurarse de que la anestesia no le cause ningn problema. Este procedimiento puede variar segn el mdico y el hospital. Sander Nephew ocurre despus del procedimiento?  Le controlarn la presin arterial, la temperatura, la frecuencia cardaca, la frecuencia respiratoria y Retail buyer de oxgeno en la sangre hasta que desaparezca el efecto de los medicamentos administrados.  Se despertar en un rea de recuperacin. Puede que se despierte lentamente.  Si est agitado o ansioso, pueden darle medicamentos que lo ayuden a Animal nutritionist.  Si volver a Actor, el mdico verificar que pueda caminar, beber y Garment/textile technologist.  El mdico tambin tratar IT sales professional o efecto secundario que tenga antes de que se vaya a su casa.  No conduzca durante 24horas si le administraron un sedante. Resumen  La anestesia general se [place] para mantenerlo quieto y Information systems manager durante un procedimiento.  Es importante que le informe al mdico acerca de sus antecedentes mdicos y cualquier ciruga que haya tenido, as como su experiencia previa con la anestesia.  Siga las instrucciones del mdico acerca de cundo dejar de comer, beber o tomar ciertos medicamentos antes del  procedimiento.  Haga que alguien lo lleve a su casa desde el hospital o la clnica. Esta informacin no tiene Marine scientist el consejo del mdico. Asegrese de hacerle al mdico cualquier pregunta que tenga. Document Released: 04/29/2010 Document Revised: 06/22/2017 Document Reviewed: 06/22/2017 Elsevier Interactive Patient Education  2019 [place] general en adultos, cuidados posteriores General Anesthesia, Adult, Care After [place] esta informacin sobre cmo cuidarse despus del procedimiento. El mdico tambin podr darle instrucciones ms especficas. Comunquese con  su mdico si tiene problemas o preguntas. Qu puedo esperar despus del procedimiento? Luego del procedimiento, son comunes los siguiente efectos secundarios:  Dolor o Scientist, research (life sciences) en el lugar de la va intravenosa (i.v.).  Nuseas.  Vmitos.  Dolor de Investment banker, operational.  Dificultad para concentrarse.  Sentir fro o [employer].  Debilidad o cansancio.  Somnolencia y Programmer, applications.  Malestar y Hydrologist. Estos efectos secundarios pueden afectar partes del cuerpo que no estuvieron involucradas en la ciruga. Siga estas indicaciones en su casa:  Durante al menos 24horas despus del procedimiento:  Pdale a un adulto responsable que permanezca con usted. Es importante que alguien cuide de usted hasta que se despierte y Cabin crew.  Descanse todo lo que sea necesario.  No haga lo siguiente: ? Participar en actividades en las que podra caerse o lastimarse. ? Conducir. ? Operar maquinarias pesadas. ? Beber alcohol. ? Tomar somnferos o medicamentos que causen somnolencia. ? Firmar documentos legales ni tomar [employer]. ? Cuidar a nios por su cuenta. Qu debe comer y beber  Siga las indicaciones del mdico respecto de las restricciones de comidas o bebidas.  Cuando [employer], comience a comer cantidades pequeas de alimentos que sean blandos y fciles de Publishing copy (livianos),  como una tostada. Retome su dieta habitual de forma gradual.  Beba suficiente lquido como para mantener la orina de color amarillo plido.  Si vomita, rehidrtese tomando agua, jugo o caldo transparente. Instrucciones generales  Si tiene apnea del sueo, la [place] y ciertos medicamentos pueden aumentar el riesgo de problemas respiratorios. Siga las indicaciones del mdico respecto al uso de su dispositivo para dormir: ? Siempre que duerma, incluso durante las siestas que tome en el da. ? Mientras tome analgsicos recetados, medicamentos para dormir o medicamentos que producen somnolencia.  Reanude sus actividades normales segn lo indicado por el mdico. Pregntele al mdico qu actividades son seguras para usted.  Tome los medicamentos de venta libre y los recetados solamente como se lo haya indicado el mdico.  Si fuma, no lo haga sin supervisin.  Concurra a todas las visitas de seguimiento como se lo haya indicado el mdico. Esto es importante. Comunquese con un mdico si:  Tiene nuseas o vmitos que no mejoran con medicamentos.  No puede comer ni beber sin vomitar.  El dolor no se alivia con medicamentos.  No puede orinar.  Tiene una erupcin cutnea.  Tiene fiebre.  Presenta enrojecimiento alrededor del lugar de la va intravenosa (i.v.) que empeora. Solicite ayuda de inmediato si:  Tiene dificultad para respirar.  Siente dolor en el pecho.  Observa sangre en la orina o heces, o vomita sangre. Resumen  Despus del procedimiento, es comn tener dolor de garganta y nuseas. Tambin es comn sentirse cansado.  Pdale a un adulto responsable que permanezca con usted durante 24 horas despus de la anestesia general. Es importante que alguien cuide de usted hasta que se despierte y Cabin crew.  Cuando [employer], comience a comer cantidades pequeas de alimentos que sean blandos y fciles de Publishing copy (livianos), como una tostada. Retome su dieta habitual de forma  gradual.  Beba suficiente lquido como para mantener la orina de color amarillo plido.  Reanude sus actividades normales segn lo indicado por el mdico. Pregntele al mdico qu actividades son seguras para usted. Esta informacin no tiene Marine scientist el consejo del mdico. Asegrese de hacerle al mdico cualquier pregunta que tenga. Document Released: 12/28/2004 Document Revised: 10/25/2016 Document Reviewed: 10/25/2016 Elsevier Interactive Patient Education  2019 Elsevier  Inc.  

## 2018-02-07 ENCOUNTER — Encounter (HOSPITAL_COMMUNITY)
Admission: RE | Disposition: A | Discharge status: Home / Self Care | Payer: Self-pay | Source: Home / Self Care | Attending: General Surgery

## 2018-02-07 ENCOUNTER — Ambulatory Visit (HOSPITAL_COMMUNITY)
Admission: RE | Admit: 2018-02-07 | Discharge: 2018-02-07 | Disposition: A | Discharge status: Home / Self Care | Payer: BLUE CROSS/BLUE SHIELD | Attending: General Surgery | Admitting: General Surgery

## 2018-02-07 ENCOUNTER — Encounter (HOSPITAL_COMMUNITY): Payer: Self-pay | Admitting: *Deleted

## 2018-02-07 ENCOUNTER — Other Ambulatory Visit: Payer: Self-pay

## 2018-02-07 DIAGNOSIS — K439 Ventral hernia without obstruction or gangrene: Secondary | ICD-10-CM | POA: Diagnosis not present

## 2018-02-07 DIAGNOSIS — C61 Malignant neoplasm of prostate: Secondary | ICD-10-CM | POA: Insufficient documentation

## 2018-02-07 DIAGNOSIS — Z79899 Other long term (current) drug therapy: Secondary | ICD-10-CM | POA: Diagnosis not present

## 2018-02-07 DIAGNOSIS — Z8572 Personal history of non-Hodgkin lymphomas: Secondary | ICD-10-CM | POA: Insufficient documentation

## 2018-02-07 DIAGNOSIS — Z1211 Encounter for screening for malignant neoplasm of colon: Secondary | ICD-10-CM | POA: Insufficient documentation

## 2018-02-07 HISTORY — PX: COLONOSCOPY: SHX5424

## 2018-02-07 SURGERY — COLONOSCOPY
Anesthesia: Monitor Anesthesia Care

## 2018-02-07 MED ORDER — MEPERIDINE HCL 50 MG/ML IJ SOLN
INTRAMUSCULAR | Status: DC | PRN
  Administered 2018-02-07: 50 mg

## 2018-02-07 MED ORDER — MIDAZOLAM HCL 5 MG/5ML IJ SOLN
INTRAMUSCULAR | Status: DC | PRN
  Administered 2018-02-07: 3 mg via INTRAVENOUS

## 2018-02-07 MED ORDER — MEPERIDINE HCL 100 MG/ML IJ SOLN
INTRAMUSCULAR | Status: AC
  Filled 2018-02-07: qty 1

## 2018-02-07 MED ORDER — MIDAZOLAM HCL 5 MG/5ML IJ SOLN
INTRAMUSCULAR | Status: AC
  Filled 2018-02-07: qty 5

## 2018-02-07 MED ORDER — SODIUM CHLORIDE 0.9 % IV SOLN
INTRAVENOUS | Status: DC
  Administered 2018-02-07: 07:00:00 via INTRAVENOUS

## 2018-02-07 NOTE — Op Note (Signed)
[facility] Patient Name: Brandon Norton Procedure Date: 02/07/2018 7:06 AM MRN: [id number] Date of Birth: 06-17-1955 Attending MD: Aviva Signs , MD CSN: [id number] Age: 63 Admit Type: Outpatient Procedure:                Colonoscopy Indications:              Screening for colorectal malignant neoplasm Providers:                Aviva Signs, MD, Janeece Riggers, RN, Aram Candela Referring MD:              Medicines:                Midazolam 3 mg IV, Meperidine 50 mg IV Complications:            No immediate complications. Estimated blood loss:                            None. Estimated Blood Loss:     Estimated blood loss: none. Procedure:                Pre-Anesthesia Assessment:                           - Prior to the procedure, a History and Physical                            was performed, and patient medications and                            allergies were reviewed. The patient is competent.                            The risks and benefits of the procedure and the                            sedation options and risks were discussed with the                            patient. All questions were answered and informed                            consent was obtained. Patient identification and                            proposed procedure were verified by the physician,                            the nurse and the technician in the endoscopy                            suite. Mental Status Examination: alert and                            oriented. Airway Examination: normal oropharyngeal  airway and neck mobility. Respiratory Examination:                            clear to auscultation. CV Examination: RRR, no                            murmurs, no S3 or S4. Prophylactic Antibiotics: The                            patient does not require prophylactic antibiotics.                            Prior Anticoagulants: The patient has taken no                previous anticoagulant or antiplatelet agents. ASA                            Grade Assessment: II - A patient with mild systemic                            disease. After reviewing the risks and benefits,                            the patient was deemed in satisfactory condition to                            undergo the procedure. The anesthesia plan was to                            use moderate sedation / analgesia (conscious                            sedation). Immediately prior to administration of                            medications, the patient was re-assessed for                            adequacy to receive sedatives. The heart rate,                            respiratory rate, oxygen saturations, blood                            pressure, adequacy of pulmonary ventilation, and                            response to care were monitored throughout the                            procedure. The physical status of the patient was                            re-assessed after the  procedure.                           After obtaining informed consent, the colonoscope                            was passed under direct vision. Throughout the                            procedure, the patient's blood pressure, pulse, and                            oxygen saturations were monitored continuously. The                            CF-HQ190L ([id number]) scope was introduced through                            the anus and advanced to the the cecum, identified                            by the appendiceal orifice, ileocecal valve and                            palpation. No anatomical landmarks were                            photographed. The entire colon was well visualized.                            The colonoscopy was performed without difficulty.                            The patient tolerated the procedure well. The                            quality of the bowel preparation was  adequate. The                            total duration of the procedure was 10 minutes. Scope In: 7:29:54 AM Scope Out: 7:38:23 AM Total Procedure Duration: 0 hours 8 minutes 29 seconds  Findings:      The perianal and digital rectal examinations were normal.      The entire examined colon appeared normal on direct and retroflexion       views. Impression:               - The entire examined colon is normal on direct and                            retroflexion views.                           - No specimens collected. Moderate Sedation:      Moderate (conscious) sedation was administered by the endoscopy nurse  and supervised by the endoscopist. The following parameters were       monitored: oxygen saturation, heart rate, blood pressure, and response       to care. Recommendation:           - Written discharge instructions were provided to                            the patient.                           - The signs and symptoms of potential delayed                            complications were discussed with the patient.                           - Patient has a contact number available for                            emergencies.                           - Return to normal activities tomorrow.                           - Resume previous diet.                           - Continue present medications.                           - Repeat colonoscopy in 10 years for screening                            purposes. Procedure Code(s):        --- Professional ---                           [id number], Colonoscopy, flexible; diagnostic, including                            collection of specimen(s) by brushing or washing,                            when performed (separate procedure) Diagnosis Code(s):        --- Professional ---                           Z12.11, Encounter for screening for malignant                            neoplasm of colon CPT copyright 2018 American Medical  Association. All rights reserved. The codes documented in this report are preliminary and upon coder review may  be revised to meet current compliance requirements. Aviva Signs, MD Aviva Signs, MD 02/07/2018 7:42:36 AM This report has been signed electronically. Number of Addenda: 0

## 2018-02-07 NOTE — OR Nursing (Signed)
[employer] interpreter Seth Bake, [phone] used for pre-op nurse's assessment.

## 2018-02-07 NOTE — Discharge Instructions (Signed)
Colonoscopy, Adult, Care After  This sheet gives you information about how to care for yourself after your procedure. Your health care provider may also give you more specific instructions. If you have problems or questions, contact your health care provider.  What can I expect after the procedure?  After the procedure, it is common to have:  · A small amount of blood in your stool for 24 hours after the procedure.  · Some gas.  · Mild abdominal cramping or bloating.  Follow these instructions at home:  General instructions  · For the first 24 hours after the procedure:  ? Do not drive or use machinery.  ? Do not sign important documents.  ? Do not drink alcohol.  ? Do your regular daily activities at a slower pace than normal.  ? Eat soft, easy-to-digest foods.  · Take over-the-counter or prescription medicines only as told by your health care provider.  Relieving cramping and bloating    · Try walking around when you have cramps or feel bloated.  · Apply heat to your abdomen as told by your health care provider. Use a heat source that your health care provider recommends, such as a moist heat pack or a heating pad.  ? Place a towel between your skin and the heat source.  ? Leave the heat on for 20-30 minutes.  ? Remove the heat if your skin turns bright red. This is especially important if you are unable to feel pain, heat, or cold. You may have a greater risk of getting burned.  Eating and drinking    · Drink enough fluid to keep your urine pale yellow.  · Resume your normal diet as instructed by your health care provider. Avoid heavy or fried foods that are hard to digest.  · Avoid drinking alcohol for as long as instructed by your health care provider.  Contact a health care provider if:  · You have blood in your stool 2-3 days after the procedure.  Get help right away if:  · You have more than a small spotting of blood in your stool.  · You pass large blood clots in your stool.  · Your abdomen is  swollen.  · You have nausea or vomiting.  · You have a fever.  · You have increasing abdominal pain that is not relieved with medicine.  Summary  · After the procedure, it is common to have a small amount of blood in your stool. You may also have mild abdominal cramping and bloating.  · For the first 24 hours after the procedure, do not drive or use machinery, sign important documents, or drink alcohol.  · Contact your health care provider if you have a lot of blood in your stool, nausea or vomiting, a fever, or increased abdominal pain.  This information is not intended to replace advice given to you by your health care provider. Make sure you discuss any questions you have with your health care provider.  Document Released: 08/12/2003 Document Revised: 10/20/2016 Document Reviewed: 03/11/2015  Elsevier Interactive Patient Education © 2019 Elsevier Inc.

## 2018-02-07 NOTE — Interval H&P Note (Signed)
History and Physical Interval Note:  02/07/2018 7:25 AM  Brandon Norton  has presented today for surgery, with the diagnosis of screening  The various methods of treatment have been discussed with the patient and family. After consideration of risks, benefits and other options for treatment, the patient has consented to  Procedure(s): COLONOSCOPY (N/A) as a surgical intervention .  The patient's history has been reviewed, patient examined, no change in status, stable for surgery.  I have reviewed the patient's chart and labs.  Questions were answered to the patient's satisfaction.     Aviva Signs

## 2018-02-08 ENCOUNTER — Encounter (HOSPITAL_COMMUNITY): Payer: Self-pay

## 2018-02-08 ENCOUNTER — Other Ambulatory Visit: Payer: Self-pay

## 2018-02-08 ENCOUNTER — Encounter (HOSPITAL_COMMUNITY)
Admission: RE | Admit: 2018-02-08 | Discharge: 2018-02-08 | Disposition: A | Discharge status: Home / Self Care | Payer: BLUE CROSS/BLUE SHIELD | Source: Ambulatory Visit | Attending: General Surgery | Admitting: General Surgery

## 2018-02-08 DIAGNOSIS — Z01812 Encounter for preprocedural laboratory examination: Secondary | ICD-10-CM | POA: Diagnosis present

## 2018-02-08 LAB — CBC WITH DIFFERENTIAL/PLATELET
Abs Immature Granulocytes: 0.02 10*3/uL (ref 0.00–0.07)
BASOS ABS: 0 10*3/uL (ref 0.0–0.1)
Basophils Relative: 1 %
Eosinophils Absolute: 0.3 10*3/uL (ref 0.0–0.5)
Eosinophils Relative: 7 %
HCT: 33.8 % — ABNORMAL LOW (ref 39.0–52.0)
HEMOGLOBIN: 11 g/dL — AB (ref 13.0–17.0)
Immature Granulocytes: 1 %
LYMPHS PCT: 29 %
Lymphs Abs: 1.1 10*3/uL (ref 0.7–4.0)
MCH: 31.9 pg (ref 26.0–34.0)
MCHC: 32.5 g/dL (ref 30.0–36.0)
MCV: 98 fL (ref 80.0–100.0)
Monocytes Absolute: 0.3 10*3/uL (ref 0.1–1.0)
Monocytes Relative: 7 %
NEUTROS ABS: 2.3 10*3/uL (ref 1.7–7.7)
Neutrophils Relative %: 55 %
Platelets: 218 10*3/uL (ref 150–400)
RBC: 3.45 MIL/uL — ABNORMAL LOW (ref 4.22–5.81)
RDW: 12.2 % (ref 11.5–15.5)
WBC: 4 10*3/uL (ref 4.0–10.5)
nRBC: 0 % (ref 0.0–0.2)

## 2018-02-08 LAB — BASIC METABOLIC PANEL
Anion gap: 9 (ref 5–15)
BUN: 11 mg/dL (ref 8–23)
CO2: 24 mmol/L (ref 22–32)
Calcium: 8.9 mg/dL (ref 8.9–10.3)
Chloride: 103 mmol/L (ref 98–111)
Creatinine, Ser: 0.69 mg/dL (ref 0.61–1.24)
GFR calc non Af Amer: >60 mL/min (ref >60)
Glucose, Bld: 115 mg/dL — ABNORMAL HIGH (ref 70–99)
Potassium: 3.4 mmol/L — ABNORMAL LOW (ref 3.5–5.1)
Sodium: 136 mmol/L (ref 135–145)

## 2018-02-09 ENCOUNTER — Encounter (HOSPITAL_COMMUNITY): Payer: Self-pay | Admitting: General Surgery

## 2018-02-10 ENCOUNTER — Encounter (HOSPITAL_COMMUNITY): Payer: Self-pay | Admitting: *Deleted

## 2018-02-10 ENCOUNTER — Encounter (HOSPITAL_COMMUNITY)
Admission: RE | Disposition: A | Discharge status: Home / Self Care | Payer: Self-pay | Source: Home / Self Care | Attending: General Surgery

## 2018-02-10 ENCOUNTER — Ambulatory Visit (HOSPITAL_COMMUNITY): Payer: BLUE CROSS/BLUE SHIELD | Admitting: Anesthesiology

## 2018-02-10 ENCOUNTER — Ambulatory Visit (HOSPITAL_COMMUNITY)
Admission: RE | Admit: 2018-02-10 | Discharge: 2018-02-10 | Disposition: A | Discharge status: Home / Self Care | Payer: BLUE CROSS/BLUE SHIELD | Attending: General Surgery | Admitting: General Surgery

## 2018-02-10 DIAGNOSIS — K439 Ventral hernia without obstruction or gangrene: Secondary | ICD-10-CM | POA: Diagnosis present

## 2018-02-10 DIAGNOSIS — C61 Malignant neoplasm of prostate: Secondary | ICD-10-CM | POA: Diagnosis not present

## 2018-02-10 DIAGNOSIS — Z8572 Personal history of non-Hodgkin lymphomas: Secondary | ICD-10-CM | POA: Insufficient documentation

## 2018-02-10 DIAGNOSIS — Z79899 Other long term (current) drug therapy: Secondary | ICD-10-CM | POA: Diagnosis not present

## 2018-02-10 HISTORY — PX: VENTRAL HERNIA REPAIR: SHX424

## 2018-02-10 SURGERY — REPAIR, HERNIA, VENTRAL
Anesthesia: General

## 2018-02-10 MED ORDER — MEPERIDINE HCL 50 MG/ML IJ SOLN: 6.2500 mg | INTRAMUSCULAR | Status: DC | PRN

## 2018-02-10 MED ORDER — POVIDONE-IODINE 10 % OINT PACKET
TOPICAL_OINTMENT | CUTANEOUS | Status: DC | PRN
  Administered 2018-02-10: 1 via TOPICAL

## 2018-02-10 MED ORDER — GLYCOPYRROLATE PF 0.2 MG/ML IJ SOSY
PREFILLED_SYRINGE | INTRAMUSCULAR | Status: DC | PRN
  Administered 2018-02-10: .2 mg via INTRAVENOUS

## 2018-02-10 MED ORDER — ONDANSETRON HCL 4 MG/2ML IJ SOLN
INTRAMUSCULAR | Status: AC
  Filled 2018-02-10: qty 2

## 2018-02-10 MED ORDER — HYDROCODONE-ACETAMINOPHEN 5-325 MG PO TABS: 1.0000 | ORAL_TABLET | ORAL | 0 refills | Status: DC | PRN

## 2018-02-10 MED ORDER — EPHEDRINE SULFATE 50 MG/ML IJ SOLN
INTRAMUSCULAR | Status: DC | PRN
  Administered 2018-02-10: 10 mg via INTRAVENOUS

## 2018-02-10 MED ORDER — LACTATED RINGERS IV SOLN
INTRAVENOUS | Status: DC | PRN
  Administered 2018-02-10: 08:00:00 via INTRAVENOUS

## 2018-02-10 MED ORDER — POVIDONE-IODINE 10 % EX OINT
TOPICAL_OINTMENT | CUTANEOUS | Status: AC
  Filled 2018-02-10: qty 1

## 2018-02-10 MED ORDER — LIDOCAINE 2% (20 MG/ML) 5 ML SYRINGE
INTRAMUSCULAR | Status: DC | PRN
  Administered 2018-02-10: 40 mg via INTRAVENOUS

## 2018-02-10 MED ORDER — DEXAMETHASONE SODIUM PHOSPHATE 4 MG/ML IJ SOLN
INTRAMUSCULAR | Status: DC | PRN
  Administered 2018-02-10: 10 mg via INTRAVENOUS

## 2018-02-10 MED ORDER — CHLORHEXIDINE GLUCONATE CLOTH 2 % EX PADS: 6.0000 | MEDICATED_PAD | Freq: Once | CUTANEOUS | Status: DC

## 2018-02-10 MED ORDER — FENTANYL CITRATE (PF) 250 MCG/5ML IJ SOLN
INTRAMUSCULAR | Status: AC
  Filled 2018-02-10: qty 5

## 2018-02-10 MED ORDER — DEXAMETHASONE SODIUM PHOSPHATE 10 MG/ML IJ SOLN
INTRAMUSCULAR | Status: AC
  Filled 2018-02-10: qty 1

## 2018-02-10 MED ORDER — PROPOFOL 10 MG/ML IV BOLUS
INTRAVENOUS | Status: AC
  Filled 2018-02-10: qty 20

## 2018-02-10 MED ORDER — CEFAZOLIN SODIUM-DEXTROSE 2-4 GM/100ML-% IV SOLN
INTRAVENOUS | Status: AC
  Filled 2018-02-10: qty 100

## 2018-02-10 MED ORDER — SUCCINYLCHOLINE CHLORIDE 20 MG/ML IJ SOLN
INTRAMUSCULAR | Status: DC | PRN
  Administered 2018-02-10: 140 mg via INTRAVENOUS

## 2018-02-10 MED ORDER — BUPIVACAINE LIPOSOME 1.3 % IJ SUSP
INTRAMUSCULAR | Status: DC | PRN
  Administered 2018-02-10: 20 mL

## 2018-02-10 MED ORDER — EPHEDRINE 5 MG/ML INJ
INTRAVENOUS | Status: AC
  Filled 2018-02-10: qty 10

## 2018-02-10 MED ORDER — LACTATED RINGERS IV SOLN
INTRAVENOUS | Status: DC
  Administered 2018-02-10: 1000 mL via INTRAVENOUS

## 2018-02-10 MED ORDER — FENTANYL CITRATE (PF) 100 MCG/2ML IJ SOLN
INTRAMUSCULAR | Status: DC | PRN
  Administered 2018-02-10: 50 ug via INTRAVENOUS
  Administered 2018-02-10: 100 ug via INTRAVENOUS

## 2018-02-10 MED ORDER — KETOROLAC TROMETHAMINE 30 MG/ML IJ SOLN
30.0000 mg | Freq: Once | INTRAMUSCULAR | Status: AC
  Administered 2018-02-10: 30 mg via INTRAVENOUS
  Filled 2018-02-10: qty 1

## 2018-02-10 MED ORDER — LACTATED RINGERS IV SOLN: INTRAVENOUS | Status: DC

## 2018-02-10 MED ORDER — BUPIVACAINE LIPOSOME 1.3 % IJ SUSP
INTRAMUSCULAR | Status: AC
  Filled 2018-02-10: qty 20

## 2018-02-10 MED ORDER — ONDANSETRON HCL 4 MG/2ML IJ SOLN
INTRAMUSCULAR | Status: DC | PRN
  Administered 2018-02-10: 4 mg via INTRAVENOUS

## 2018-02-10 MED ORDER — CEFAZOLIN SODIUM-DEXTROSE 2-4 GM/100ML-% IV SOLN
2.0000 g | INTRAVENOUS | Status: AC
  Administered 2018-02-10: 2 g via INTRAVENOUS

## 2018-02-10 MED ORDER — HYDROMORPHONE HCL 1 MG/ML IJ SOLN
0.2500 mg | INTRAMUSCULAR | Status: DC | PRN
  Administered 2018-02-10: 0.5 mg via INTRAVENOUS

## 2018-02-10 MED ORDER — 0.9 % SODIUM CHLORIDE (POUR BTL) OPTIME
TOPICAL | Status: DC | PRN
  Administered 2018-02-10: 1000 mL

## 2018-02-10 MED ORDER — HYDROCODONE-ACETAMINOPHEN 7.5-325 MG PO TABS: 1.0000 | ORAL_TABLET | Freq: Once | ORAL | Status: DC | PRN

## 2018-02-10 MED ORDER — HYDROMORPHONE HCL 1 MG/ML IJ SOLN
INTRAMUSCULAR | Status: AC
  Filled 2018-02-10: qty 0.5

## 2018-02-10 MED ORDER — PROMETHAZINE HCL 25 MG/ML IJ SOLN: 6.2500 mg | INTRAMUSCULAR | Status: DC | PRN

## 2018-02-10 MED ORDER — PROPOFOL 10 MG/ML IV BOLUS
INTRAVENOUS | Status: DC | PRN
  Administered 2018-02-10: 180 mg via INTRAVENOUS

## 2018-02-10 SURGICAL SUPPLY — 33 items
CHLORAPREP W/TINT 26ML (MISCELLANEOUS) ×3 IMPLANT
CLOTH BEACON ORANGE TIMEOUT ST (SAFETY) ×3 IMPLANT
COVER LIGHT HANDLE STERIS (MISCELLANEOUS) ×6 IMPLANT
COVER WAND RF STERILE (DRAPES) ×3 IMPLANT
ELECT REM PT RETURN 9FT ADLT (ELECTROSURGICAL) ×3
ELECTRODE REM PT RTRN 9FT ADLT (ELECTROSURGICAL) ×1 IMPLANT
GAUZE 4X4 16PLY RFD (DISPOSABLE) ×3 IMPLANT
GAUZE SPONGE 4X4 12PLY STRL (GAUZE/BANDAGES/DRESSINGS) ×3 IMPLANT
GLOVE BIO SURGEON STRL SZ7 (GLOVE) ×3 IMPLANT
GLOVE BIOGEL PI IND STRL 7.0 (GLOVE) ×4 IMPLANT
GLOVE BIOGEL PI INDICATOR 7.0 (GLOVE) ×8
GLOVE ECLIPSE 7.0 STRL STRAW (GLOVE) ×6 IMPLANT
GLOVE SURG SS PI 7.5 STRL IVOR (GLOVE) ×3 IMPLANT
GOWN STRL REUS W/TWL LRG LVL3 (GOWN DISPOSABLE) ×12 IMPLANT
INST SET MINOR GENERAL (KITS) ×3 IMPLANT
KIT TURNOVER KIT A (KITS) ×3 IMPLANT
LIGASURE IMPACT 36 18CM CVD LR (INSTRUMENTS) ×3 IMPLANT
MANIFOLD NEPTUNE II (INSTRUMENTS) ×3 IMPLANT
MESH VENTRALEX ST 2.5 CRC MED (Mesh General) ×3 IMPLANT
NEEDLE 22X1 1/2 OR ONLY (MISCELLANEOUS) ×2
NEEDLE 22X1.5 STRL (OR ONLY) (MISCELLANEOUS) ×1 IMPLANT
NEEDLE HYPO 18GX1.5 BLUNT FILL (NEEDLE) ×3 IMPLANT
NS IRRIG 1000ML POUR BTL (IV SOLUTION) ×3 IMPLANT
PACK ABDOMINAL MAJOR (CUSTOM PROCEDURE TRAY) ×3 IMPLANT
PAD ARMBOARD 7.5X6 YLW CONV (MISCELLANEOUS) ×3 IMPLANT
SET BASIN LINEN APH (SET/KITS/TRAYS/PACK) ×3 IMPLANT
SPONGE LAP 18X18 RF (DISPOSABLE) ×3 IMPLANT
STAPLER VISISTAT (STAPLE) ×3 IMPLANT
SUT ETHIBOND NAB MO 7 #0 18IN (SUTURE) ×3 IMPLANT
SUT VIC AB 3-0 SH 27 (SUTURE) ×2
SUT VIC AB 3-0 SH 27X BRD (SUTURE) ×1 IMPLANT
SYR 20CC LL (SYRINGE) ×6 IMPLANT
TAPE CLOTH SURG 4X10 WHT LF (GAUZE/BANDAGES/DRESSINGS) ×3 IMPLANT

## 2018-02-10 NOTE — Transfer of Care (Signed)
Immediate Anesthesia Transfer of Care Note  Patient: Brandon Norton  Procedure(s) Performed: VENTRAL HERNIORRHAPHY WITH MESH (N/A )  Patient Location: PACU  Anesthesia Type:General  Level of Consciousness: awake, alert , oriented and patient cooperative  Airway & Oxygen Therapy: Patient Spontanous Breathing  Post-op Assessment: Report given to RN and Post -op Vital signs reviewed and stable  Post vital signs: Reviewed and stable  Last Vitals:  Vitals Value Taken Time  BP 111/68 02/10/2018  9:34 AM  Temp 36.7 C 02/10/2018  9:33 AM  Pulse 55 02/10/2018  9:36 AM  Resp 15 02/10/2018  9:36 AM  SpO2 100 % 02/10/2018  9:36 AM  Vitals shown include unvalidated device data.  Last Pain:  Vitals:   02/10/18 0750  TempSrc: Oral         Complications: No apparent anesthesia complications

## 2018-02-10 NOTE — Anesthesia Preprocedure Evaluation (Addendum)
Anesthesia Evaluation    Airway Mallampati: III       Dental  (+) Missing, Dental Advidsory Given   Pulmonary former smoker,     + decreased breath sounds      Cardiovascular  Rate:Bradycardia     Neuro/Psych    GI/Hepatic GERD  ,  Endo/Other    Renal/GU      Musculoskeletal   Abdominal   Peds  Hematology   Anesthesia Other Findings B cell lymphoma GERD SB strip around 54-48   Reproductive/Obstetrics                            Anesthesia Physical Anesthesia Plan  ASA: III  Anesthesia Plan: General   Post-op Pain Management:    Induction:   PONV Risk Score and Plan:   Airway Management Planned:   Additional Equipment:   Intra-op Plan:   Post-operative Plan:   Informed Consent: I have reviewed the patients History and Physical, chart, labs and discussed the procedure including the risks, benefits and alternatives for the proposed anesthesia with the patient or authorized representative who has indicated his/her understanding and acceptance.       Plan Discussed with: Anesthesiologist  Anesthesia Plan Comments:         Anesthesia Quick Evaluation

## 2018-02-10 NOTE — Interval H&P Note (Signed)
History and Physical Interval Note:  02/10/2018 7:57 AM  Brandon Norton  has presented today for surgery, with the diagnosis of spigelian hernia  The various methods of treatment have been discussed with the patient and family. After consideration of risks, benefits and other options for treatment, the patient has consented to  Procedure(s): HERNIA REPAIR VENTRAL ADULT WITH MESH (N/A) as a surgical intervention .  The patient's history has been reviewed, patient examined, no change in status, stable for surgery.  I have reviewed the patient's chart and labs.  Questions were answered to the patient's satisfaction.     Aviva Signs

## 2018-02-10 NOTE — Discharge Instructions (Signed)
Reparacin de una hernia abierta, en adultos, cuidados posteriores Open Hernia Repair, Adult, Care After Lea esta informacin sobre cmo cuidarse despus del procedimiento. Su mdico tambin podr darle indicaciones ms especficas. Comunquese con su mdico si tiene problemas o preguntas. Qu puedo esperar despus del procedimiento? Despus del procedimiento, es comn [employer] siguientes sntomas:  Molestias leves.  Hematomas leves.  Algo de hinchazn.  Dolor en el abdomen. Siga estas indicaciones en su casa: Cuidados de la incisin   Siga las indicaciones del mdico acerca del cuidado de la zona de la incisin. Haga lo siguiente: ? Lvese las manos con agua y jabn antes de cambiar la venda (vendaje). Use desinfectante para manos si no dispone de [place]. ? Cambie el vendaje como se lo haya indicado el mdico. ? No retire los puntos (suturas), la goma para cerrar la piel o las tiras [place]. Es posible que estos cierres cutneos Animal nutritionist en la piel durante 2semanas o ms tiempo. Si los bordes de las tiras adhesivas empiezan a despegarse y Therapist, sports, puede recortar los que estn sueltos. No retire las tiras [facility] por completo a menos que el mdico se lo indique.  [place] zona de la incisin para detectar signos de infeccin. Est atenta a los siguientes signos: ? Aumento del enrojecimiento, la hinchazn o Conservation officer, historic buildings. ? Mayor presencia de lquido o [place]. ? Calor. ? Pus o mal olor. Actividad  No conduzca ni use maquinaria pesada mientras toma analgsicos recetados. No conduzca hasta que el mdico lo autorice.  Hasta que el mdico la autorice: ? No levante ningn objeto que pese ms de 10libras (4,5kg). ? No practique deportes de contacto.  Retome sus actividades normales como se lo haya indicado el mdico. Pregntele al mdico qu actividades son seguras. Instrucciones generales  A fin de prevenir o tratar el estreimiento mientras toma  analgsicos recetados, el mdico puede recomendarle lo siguiente: ? Beba suficiente lquido para mantener la orina clara o de color amarillo plido. ? Tome medicamentos recetados o de venta [place]. ? Consuma alimentos ricos en fibra, como frutas y verduras frescas, cereales integrales y frijoles. ? Limite el consumo de alimentos con alto contenido de grasas y azcares procesados, como alimentos fritos o dulces.  Tome los medicamentos de venta libre y los recetados solamente como se lo haya indicado el mdico.  No tome baos de inmersin hasta que el mdico lo autorice.  Concurra a todas las visitas de seguimiento como se lo haya indicado el mdico. Esto es importante. Comunquese con un mdico si:  Presenta una erupcin cutnea.  Aumentan el enrojecimiento, la hinchazn o el dolor alrededor de la incisin.  Le sale ms lquido o sangre de la incisin.  La incisin est caliente al tacto.  Tiene pus o percibe que sale mal olor del lugar de la incisin.  Tiene fiebre o siente escalofros.  Observa sangre en la materia fecal (heces).  No defeca durante 2 o 3das.  El dolor no se alivia con los [employer]. Solicite ayuda de inmediato si:  Siente falta de aire o dolor en el pecho.  Se siente mareado o se desmaya.  Siente dolor intenso.  Vomita y Printmaker. Esta informacin no tiene Marine scientist el consejo del mdico. Asegrese de hacerle al mdico cualquier pregunta que tenga. Document Released: 04/06/2007 Document Revised: 05/11/2016 Document Reviewed: 06/11/2015 Elsevier Interactive Patient Education  2019 [employer].

## 2018-02-10 NOTE — Anesthesia Postprocedure Evaluation (Signed)
Anesthesia Post Note Late Entry for 0950  Patient: Brandon Norton  Procedure(s) Performed: VENTRAL HERNIORRHAPHY WITH MESH (N/A )  Patient location during evaluation: PACU Anesthesia Type: General Level of consciousness: awake and alert and oriented Pain management: pain level controlled Vital Signs Assessment: post-procedure vital signs reviewed and stable Respiratory status: spontaneous breathing Cardiovascular status: stable Postop Assessment: no apparent nausea or vomiting Anesthetic complications: no     Last Vitals:  Vitals:   02/10/18 1015 02/10/18 1023  BP:  133/69  Pulse: (!) 51 (!) 52  Resp: 14   Temp:    SpO2: 93% 93%    Last Pain:  Vitals:   02/10/18 1023  TempSrc:   PainSc: 3                  ADAMS, AMY A

## 2018-02-10 NOTE — Anesthesia Procedure Notes (Addendum)
Procedure Name: Intubation Date/Time: 02/10/2018 8:45 AM Performed by: Andree Elk, Amy A, CRNA Pre-anesthesia Checklist: Patient identified, Patient being monitored, Timeout performed, Emergency Drugs available and Suction available Patient Re-evaluated:Patient Re-evaluated prior to induction Oxygen Delivery Method: Circle system utilized Preoxygenation: Pre-oxygenation with 100% oxygen Induction Type: IV induction Ventilation: Mask ventilation without difficulty Laryngoscope Size: 3 and Glidescope Grade View: Grade I Tube type: Oral Tube size: 7.0 mm Number of attempts: 1 Airway Equipment and Method: Stylet Placement Confirmation: ETT inserted through vocal cords under direct vision,  positive ETCO2 and breath sounds checked- equal and bilateral Secured at: 21 cm Tube secured with: Tape Dental Injury: Teeth and Oropharynx as per pre-operative assessment

## 2018-02-10 NOTE — Op Note (Signed)
Patient:  Brandon Norton  DOB:  Jun 03, 1955  MRN:  [id number]   Preop Diagnosis: Right spigelian hernia  Postop Diagnosis: Same  Procedure: Ventral herniorrhaphy with mesh  Surgeon: Aviva Signs, MD  Anes: General  Indications: Patient is a 63 year old Hispanic male who presents with a symptomatic right spigelian hernia.  The risks and benefits of the procedure including bleeding, infection, mesh use, and the possibility of recurrence of the hernia were fully explained to the patient, who gave informed consent.  Procedure note: The patient was placed in the supine position.  After general anesthesia was administered, the abdomen was prepped and draped using the usual sterile technique with ChloraPrep.  Surgical site confirmation was performed.  An oblique incision was made in the right lower abdomen where the patient had been previously marked.  The dissection was taken down to the external Bleich aponeurosis.  This was incised and a 3 cm hernia defect with a hernia sac was noted close to the semilunar line.  The hernia sac was entered into and no bowel contents were present.  The hernia sac was then excised at the peritoneal surface.  Some excess properitoneal fat which was present was also excised using the LigaSure without difficulty.  A 6.4 cm Bard Ventralax ST patch was then inserted and secured to the fascia using 0 Ethibond interrupted sutures.  The overlying fascia was then closed using interrupted 0 Ethibond sutures.  The subcutaneous layer was reapproximated using 3-0 Vicryl interrupted suture.  Exparel was instilled into the surrounding wound.  The skin was closed using staples.  Betadine ointment and dry sterile dressings were applied.  All tape and needle counts were correct at the end of the procedure.  The patient was awakened and transferred to PACU in stable condition.  Complications: None  EBL: Minimal  Specimen: None

## 2018-02-13 ENCOUNTER — Encounter (HOSPITAL_COMMUNITY): Payer: Self-pay | Admitting: General Surgery

## 2018-02-21 ENCOUNTER — Ambulatory Visit: Payer: Self-pay | Admitting: General Surgery

## 2018-02-21 DIAGNOSIS — C8330 Diffuse large B-cell lymphoma, unspecified site: Secondary | ICD-10-CM | POA: Diagnosis not present

## 2018-02-21 DIAGNOSIS — C61 Malignant neoplasm of prostate: Secondary | ICD-10-CM | POA: Diagnosis not present

## 2018-02-22 DIAGNOSIS — Z192 Hormone resistant malignancy status: Secondary | ICD-10-CM | POA: Diagnosis not present

## 2018-02-22 DIAGNOSIS — M25561 Pain in right knee: Secondary | ICD-10-CM | POA: Diagnosis not present

## 2018-02-22 DIAGNOSIS — K5900 Constipation, unspecified: Secondary | ICD-10-CM | POA: Diagnosis not present

## 2018-02-22 DIAGNOSIS — C61 Malignant neoplasm of prostate: Secondary | ICD-10-CM | POA: Diagnosis not present

## 2018-02-23 ENCOUNTER — Encounter: Payer: Self-pay | Admitting: General Surgery

## 2018-02-23 ENCOUNTER — Ambulatory Visit (INDEPENDENT_AMBULATORY_CARE_PROVIDER_SITE_OTHER): Payer: Self-pay | Admitting: General Surgery

## 2018-02-23 VITALS — BP 150/95 | HR 55 | Temp 97.1°F | Resp 18 | Wt 183.0 lb

## 2018-02-23 DIAGNOSIS — Z09 Encounter for follow-up examination after completed treatment for conditions other than malignant neoplasm: Secondary | ICD-10-CM

## 2018-02-23 NOTE — Progress Notes (Signed)
Subjective:     Brandon Norton  Here for postoperative visit.  Patient doing well.  States his preoperative pain has resolved. Objective:    BP (!) 150/95 (BP Location: Left Arm, Patient Position: Sitting, Cuff Size: Normal)   Pulse (!) 55   Temp (!) 97.1 F (36.2 C) (Temporal)   Resp 18   Wt 183 lb (83 kg)   BMI 29.54 kg/m   General:  alert, cooperative and no distress  Abdomen soft, incision healing well.  Staples removed, Steri-Strips applied.     Assessment:    Doing well postoperatively.    Plan:   May increase activity as able.  May return to work without restrictions on 03/06/2018.  Follow-up here as needed.

## 2018-05-30 DIAGNOSIS — Z79818 Long term (current) use of other agents affecting estrogen receptors and estrogen levels: Secondary | ICD-10-CM | POA: Diagnosis not present

## 2018-05-30 DIAGNOSIS — M170 Bilateral primary osteoarthritis of knee: Secondary | ICD-10-CM | POA: Diagnosis not present

## 2018-05-30 DIAGNOSIS — C8330 Diffuse large B-cell lymphoma, unspecified site: Secondary | ICD-10-CM | POA: Diagnosis not present

## 2018-05-30 DIAGNOSIS — C61 Malignant neoplasm of prostate: Secondary | ICD-10-CM | POA: Diagnosis not present

## 2018-09-06 DIAGNOSIS — Z8572 Personal history of non-Hodgkin lymphomas: Secondary | ICD-10-CM | POA: Diagnosis not present

## 2018-09-06 DIAGNOSIS — Z192 Hormone resistant malignancy status: Secondary | ICD-10-CM | POA: Diagnosis not present

## 2018-09-06 DIAGNOSIS — M79606 Pain in leg, unspecified: Secondary | ICD-10-CM | POA: Diagnosis not present

## 2018-09-06 DIAGNOSIS — M25561 Pain in right knee: Secondary | ICD-10-CM | POA: Diagnosis not present

## 2018-09-06 DIAGNOSIS — M25562 Pain in left knee: Secondary | ICD-10-CM | POA: Diagnosis not present

## 2018-09-06 DIAGNOSIS — M25569 Pain in unspecified knee: Secondary | ICD-10-CM | POA: Diagnosis not present

## 2018-09-06 DIAGNOSIS — C61 Malignant neoplasm of prostate: Secondary | ICD-10-CM | POA: Diagnosis not present

## 2018-10-04 ENCOUNTER — Encounter: Payer: Self-pay | Admitting: Orthopedic Surgery

## 2018-10-04 ENCOUNTER — Other Ambulatory Visit: Payer: Self-pay

## 2018-10-04 ENCOUNTER — Ambulatory Visit: Payer: BC Managed Care – PPO | Admitting: Orthopedic Surgery

## 2018-10-04 ENCOUNTER — Ambulatory Visit: Payer: BC Managed Care – PPO

## 2018-10-04 VITALS — BP 131/71 | HR 59 | Temp 97.0°F | Ht 66.0 in | Wt 174.8 lb

## 2018-10-04 DIAGNOSIS — M170 Bilateral primary osteoarthritis of knee: Secondary | ICD-10-CM | POA: Diagnosis not present

## 2018-10-04 DIAGNOSIS — M25562 Pain in left knee: Secondary | ICD-10-CM

## 2018-10-04 MED ORDER — MELOXICAM 7.5 MG PO TABS: 7.5000 mg | ORAL_TABLET | Freq: Every day | ORAL | 5 refills | Status: DC

## 2018-10-04 NOTE — Patient Instructions (Signed)

## 2018-10-04 NOTE — Progress Notes (Signed)
Brandon Norton  10/04/2018  HISTORY SECTION :  Chief Complaint  Patient presents with  . Knee Pain    bilateral, but mainly in the LEFT, Pain has gotten worse, noticed some swelling at times   [name] is a 63 year old male who is still working had a right knee arthroscopy and meniscectomy microfracture in 2015 presents with bilateral knee pain left greater than right no prior treatment.  He is here with his daughter who is interpreting for [place].  He has a history of cancer is currently on chemotherapy with oral agents and once a month injections at [facility] for prostate cancer  He complains of moderately severe pain both knees left greater than right with swelling at the end of the day decreased range of motion and limping     Review of Systems  Constitutional: Positive for diaphoresis.  Cardiovascular: Positive for claudication and leg swelling.  Genitourinary: Positive for dysuria.  Musculoskeletal: Positive for joint pain.     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   Past Surgical History:  Procedure Laterality Date  . COLONOSCOPY    . COLONOSCOPY N/A 02/07/2018   Procedure: COLONOSCOPY;  Surgeon: Aviva Signs, MD;  [facility];  Service: Gastroenterology;  Laterality: N/A;  . HERNIA REPAIR Left   . KNEE ARTHROSCOPY WITH MEDIAL MENISECTOMY Right 05/18/2013   Procedure: RIGHT KNEE ARTHROSCOPY WITH PARTIAL MEDIAL MENISECTOMY AND MICROFRACTURE LATERAL FEMORAL CONDYLE;  Surgeon: Carole Civil, MD;  Location: [facility];  Service: Orthopedics;  Laterality: Right;  . porta cath    . VENTRAL HERNIA REPAIR N/A 02/10/2018   Procedure: VENTRAL HERNIORRHAPHY WITH MESH;  Surgeon: Aviva Signs, MD;  Location: [facility];  Service: General;  Laterality: N/A;    Body mass index is 28.21 kg/m.   No Known Allergies   Current Outpatient Medications:  .   enzalutamide (XTANDI) 40 MG capsule, Take 160 mg by mouth every evening. , Disp: , Rfl:  .  Tamsulosin HCl (FLOMAX) 0.4 MG CAPS, Take 0.4 mg by mouth daily. , Disp: , Rfl:  .  HYDROcodone-acetaminophen (NORCO) 5-325 MG tablet, Take 1 tablet by mouth every 4 (four) hours as needed for moderate pain. (Patient not taking: Reported on 10/04/2018), Disp: 40 tablet, Rfl: 0 .  meloxicam (MOBIC) 7.5 MG tablet, Take 1 tablet (7.5 mg total) by mouth daily., Disp: 30 tablet, Rfl: 5   PHYSICAL EXAM SECTION: 1) BP 131/71   Pulse (!) 59   Temp (!) 97 F (36.1 C)   Ht 5\' 6"  (1.676 m)   Wt 174 lb 12.8 oz (79.3 kg)   BMI 28.21 kg/m   Body mass index is 28.21 kg/m. General appearance: Well-developed well-nourished no gross deformities  2) Cardiovascular normal pulse and perfusion in the LOWER EXTREMties normal color without edema  3) Neurologically deep tendon reflexes are equal and normal, no sensation loss or deficits no pathologic reflexes  4) Psychological: Awake alert and oriented x3 mood and affect normal  5) Skin no lacerations or ulcerations no nodularity no palpable masses, no erythema or nodularity  6) Right Knee Exam   Muscle Strength  The patient has normal right knee strength.  Tenderness  The patient is experiencing tenderness in the medial joint line.  Range of Motion  Extension: abnormal  Flexion: normal Right knee flexion: 115.   Tests  McMurray:  Medial - negative Lateral - negative Varus:  negative Valgus: negative Drawer:  Anterior - negative    Posterior - negative  Other  Erythema: absent Scars: absent Sensation: normal Pulse: present Swelling: mild   Left Knee Exam   Muscle Strength  The patient has normal left knee strength.  Tenderness  The patient is experiencing tenderness in the medial joint line.  Range of Motion  Extension: abnormal  Flexion: normal Left knee flexion: 115.   Tests  McMurray:  Medial - negative Lateral - negative Varus:  negative Valgus: negative Drawer:  Anterior - negative     Posterior - negative  Other  Erythema: absent Scars: absent Sensation: normal Pulse: present Swelling: mild       MEDICAL DECISION SECTION:  Encounter Diagnosis  Name Primary?  . Left knee pain, unspecified chronicity Yes    Imaging X-ray shows arthritis in both knees right worse than left in the medial compartment but very few secondary bone changes see report  Plan:  (Rx., Inj., surg., Frx, MRI/CT, XR:2) Procedure note for bilateral knee injections  Procedure note left knee injection verbal consent was obtained to inject left knee joint  Timeout was completed to confirm the site of injection  The medications used were 40 mg of Depo-Medrol and 1% lidocaine 3 cc  Anesthesia was provided by ethyl chloride and the skin was prepped with alcohol.  After cleaning the skin with alcohol a 20-gauge needle was used to inject the left knee joint. There were no complications. A sterile bandage was applied.   Procedure note right knee injection verbal consent was obtained to inject right knee joint  Timeout was completed to confirm the site of injection  The medications used were 40 mg of Depo-Medrol and 1% lidocaine 3 cc  Anesthesia was provided by ethyl chloride and the skin was prepped with alcohol.  After cleaning the skin with alcohol a 20-gauge needle was used to inject the right knee joint. There were no complications. A sterile bandage was applied.   Meds ordered this encounter  Medications  . meloxicam (MOBIC) 7.5 MG tablet    Sig: Take 1 tablet (7.5 mg total) by mouth daily.    Dispense:  30 tablet    Refill:  5     9:31 AM Arther Abbott, MD  10/04/2018

## 2018-12-06 DIAGNOSIS — C61 Malignant neoplasm of prostate: Secondary | ICD-10-CM | POA: Diagnosis not present

## 2018-12-06 DIAGNOSIS — Z8572 Personal history of non-Hodgkin lymphomas: Secondary | ICD-10-CM | POA: Diagnosis not present

## 2018-12-06 DIAGNOSIS — Z23 Encounter for immunization: Secondary | ICD-10-CM | POA: Diagnosis not present

## 2018-12-06 DIAGNOSIS — Z9889 Other specified postprocedural states: Secondary | ICD-10-CM | POA: Diagnosis not present

## 2018-12-06 DIAGNOSIS — Z192 Hormone resistant malignancy status: Secondary | ICD-10-CM | POA: Diagnosis not present

## 2018-12-06 DIAGNOSIS — M25561 Pain in right knee: Secondary | ICD-10-CM | POA: Diagnosis not present

## 2018-12-06 DIAGNOSIS — M25562 Pain in left knee: Secondary | ICD-10-CM | POA: Diagnosis not present

## 2018-12-28 DIAGNOSIS — Z20828 Contact with and (suspected) exposure to other viral communicable diseases: Secondary | ICD-10-CM | POA: Diagnosis not present

## 2019-01-03 ENCOUNTER — Other Ambulatory Visit: Payer: Self-pay

## 2019-01-03 ENCOUNTER — Ambulatory Visit: Payer: BC Managed Care – PPO | Admitting: Orthopedic Surgery

## 2019-01-03 VITALS — BP 130/74 | HR 51 | Temp 96.9°F | Ht 66.0 in | Wt 176.0 lb

## 2019-01-03 DIAGNOSIS — M25562 Pain in left knee: Secondary | ICD-10-CM

## 2019-01-03 DIAGNOSIS — M170 Bilateral primary osteoarthritis of knee: Secondary | ICD-10-CM

## 2019-01-03 NOTE — Progress Notes (Signed)
Chief Complaint  Patient presents with  . Follow-up    Recheck on bilateral knee pain.    History this is a 63 year old male bilateral knee pain treated with injection and meloxicam presents back for his routine 66-month follow-up.  He says his right knee has improved significantly and his left knee dated 2 but he has noticed some increasing pain over the last month or so.  He has had no trouble taking his meloxicam and thinks that it is helping his motion and pain as well as function  Review of systems is negative for any other musculoskeletal discomfort no numbness or tingling or claudication in the lower extremities  BP 130/74   Pulse (!) 51   Temp (!) 96.9 F (36.1 C)   Ht 5\' 6"  (1.676 m)   Wt 176 lb (79.8 kg)   BMI 28.41 kg/m   Is awake alert and oriented x3 his daughter is interpreting for him  Right knee and left knee there is no joint effusion his passive range of motion is normal he has mild tenderness in the medial joint lines muscle strength and tone are normal  He is agreeable to continue with meloxicam and inject the left knee  Procedure note left knee injection   verbal consent was obtained to inject left knee joint  Timeout was completed to confirm the site of injection  The medications used were 40 mg of Depo-Medrol and 1% lidocaine 3 cc  Anesthesia was provided by ethyl chloride and the skin was prepped with alcohol.  After cleaning the skin with alcohol a 20-gauge needle was used to inject the left knee joint. There were no complications. A sterile bandage was applied.  Follow-up in 3 months with x-rays

## 2019-01-03 NOTE — Patient Instructions (Signed)
Continue meloxicam once a day  Maintain a good weight

## 2019-02-27 DIAGNOSIS — R590 Localized enlarged lymph nodes: Secondary | ICD-10-CM | POA: Diagnosis not present

## 2019-02-27 DIAGNOSIS — C8330 Diffuse large B-cell lymphoma, unspecified site: Secondary | ICD-10-CM | POA: Diagnosis not present

## 2019-02-27 DIAGNOSIS — N471 Phimosis: Secondary | ICD-10-CM | POA: Diagnosis not present

## 2019-02-27 DIAGNOSIS — R9721 Rising PSA following treatment for malignant neoplasm of prostate: Secondary | ICD-10-CM | POA: Diagnosis not present

## 2019-02-27 DIAGNOSIS — C61 Malignant neoplasm of prostate: Secondary | ICD-10-CM | POA: Diagnosis not present

## 2019-02-27 DIAGNOSIS — Z8579 Personal history of other malignant neoplasms of lymphoid, hematopoietic and related tissues: Secondary | ICD-10-CM | POA: Diagnosis not present

## 2019-02-27 DIAGNOSIS — R300 Dysuria: Secondary | ICD-10-CM | POA: Diagnosis not present

## 2019-02-27 DIAGNOSIS — M170 Bilateral primary osteoarthritis of knee: Secondary | ICD-10-CM | POA: Diagnosis not present

## 2019-03-16 DIAGNOSIS — C61 Malignant neoplasm of prostate: Secondary | ICD-10-CM | POA: Diagnosis not present

## 2019-03-16 DIAGNOSIS — R39198 Other difficulties with micturition: Secondary | ICD-10-CM | POA: Diagnosis not present

## 2019-03-16 DIAGNOSIS — N471 Phimosis: Secondary | ICD-10-CM | POA: Insufficient documentation

## 2019-04-04 ENCOUNTER — Encounter: Payer: Self-pay | Admitting: Orthopedic Surgery

## 2019-04-04 ENCOUNTER — Ambulatory Visit (INDEPENDENT_AMBULATORY_CARE_PROVIDER_SITE_OTHER): Payer: BC Managed Care – PPO | Admitting: Orthopedic Surgery

## 2019-04-04 ENCOUNTER — Other Ambulatory Visit: Payer: Self-pay

## 2019-04-04 ENCOUNTER — Ambulatory Visit: Payer: BC Managed Care – PPO

## 2019-04-04 VITALS — Ht 66.0 in | Wt 180.0 lb

## 2019-04-04 DIAGNOSIS — G8929 Other chronic pain: Secondary | ICD-10-CM

## 2019-04-04 DIAGNOSIS — M25562 Pain in left knee: Secondary | ICD-10-CM | POA: Diagnosis not present

## 2019-04-04 DIAGNOSIS — R112 Nausea with vomiting, unspecified: Secondary | ICD-10-CM | POA: Insufficient documentation

## 2019-04-04 MED ORDER — HYDROCODONE-ACETAMINOPHEN 5-325 MG PO TABS: 1.0000 | ORAL_TABLET | ORAL | 0 refills | Status: DC | PRN

## 2019-04-04 MED ORDER — MELOXICAM 7.5 MG PO TABS: 7.5000 mg | ORAL_TABLET | Freq: Every day | ORAL | 5 refills | Status: DC

## 2019-04-04 NOTE — Patient Instructions (Signed)
Continue the medications   You have received an injection of steroids into the joint. 15% of patients will have increased pain within the 24 hours postinjection.   This is transient and will go away.   We recommend that you use ice packs on the injection site for 20 minutes every 2 hours and extra strength Tylenol 2 tablets every 8 as needed until the pain resolves.  If you continue to have pain after taking the Tylenol and using the ice please call the office for further instructions.

## 2019-04-04 NOTE — Progress Notes (Signed)
Chief Complaint  Patient presents with  . Knee Pain    left    History this is a 64 year old male bilateral knee pain treated with injection and meloxicam presents back for his routine 61-month follow-up.  He says his right knee has improved significantly and his left knee dated 2 but he has noticed some increasing pain over the last month or so.   He has had no trouble taking his meloxicam and thinks that it is helping his motion and pain as well as function   Review of systems is negative for any other musculoskeletal discomfort no numbness or tingling or claudication in the lower extremities   64 year old male for follow-up he is taking his meloxicam and hydrocodone and seems to be getting good pain relief.  His pain is no worse than it was when he came here and is actually controlled well with the 2 medications  He tried not taking them for 2 days and noticed that his pain was definitely worse  His daughter is with him for interpretation  Exam shows a well-developed well-nourished male grooming and hygiene are intact he has slight flexion contracture in the left knee and he flexes only to 95 degrees he has tenderness over the medial compartment with no effusion skin is intact neurovascular exam is normal  Our x-ray today shows that he has narrowing of the medial compartment of the knee; no worse than his previous x-ray back in September  We will refill his medications and inject his left knee Encounter Diagnosis  Name Primary?  . Chronic pain of left knee Yes    Procedure note left knee injection   verbal consent was obtained to inject left knee joint  Timeout was completed to confirm the site of injection  The medications used were 40 mg of Depo-Medrol and 1% lidocaine 3 cc  Anesthesia was provided by ethyl chloride and the skin was prepped with alcohol.  After cleaning the skin with alcohol a 20-gauge needle was used to inject the left knee joint. There were no  complications. A sterile bandage was applied.   Meds ordered this encounter  Medications  . HYDROcodone-acetaminophen (NORCO) 5-325 MG tablet    Sig: Take 1 tablet by mouth every 4 (four) hours as needed for moderate pain.    Dispense:  40 tablet    Refill:  0  . meloxicam (MOBIC) 7.5 MG tablet    Sig: Take 1 tablet (7.5 mg total) by mouth daily.    Dispense:  30 tablet    Refill:  5

## 2019-04-13 DIAGNOSIS — N471 Phimosis: Secondary | ICD-10-CM | POA: Diagnosis not present

## 2019-06-06 DIAGNOSIS — C61 Malignant neoplasm of prostate: Secondary | ICD-10-CM | POA: Diagnosis not present

## 2019-06-06 DIAGNOSIS — C8330 Diffuse large B-cell lymphoma, unspecified site: Secondary | ICD-10-CM | POA: Diagnosis not present

## 2019-06-06 DIAGNOSIS — N471 Phimosis: Secondary | ICD-10-CM | POA: Diagnosis not present

## 2019-06-06 DIAGNOSIS — Z8572 Personal history of non-Hodgkin lymphomas: Secondary | ICD-10-CM | POA: Diagnosis not present

## 2019-06-06 DIAGNOSIS — Z192 Hormone resistant malignancy status: Secondary | ICD-10-CM | POA: Diagnosis not present

## 2019-06-06 DIAGNOSIS — M170 Bilateral primary osteoarthritis of knee: Secondary | ICD-10-CM | POA: Diagnosis not present

## 2019-06-06 DIAGNOSIS — R519 Headache, unspecified: Secondary | ICD-10-CM | POA: Diagnosis not present

## 2019-06-06 DIAGNOSIS — R300 Dysuria: Secondary | ICD-10-CM | POA: Diagnosis not present

## 2019-06-06 DIAGNOSIS — Z5111 Encounter for antineoplastic chemotherapy: Secondary | ICD-10-CM | POA: Diagnosis not present

## 2019-06-06 DIAGNOSIS — R9721 Rising PSA following treatment for malignant neoplasm of prostate: Secondary | ICD-10-CM | POA: Diagnosis not present

## 2019-06-06 DIAGNOSIS — Z79899 Other long term (current) drug therapy: Secondary | ICD-10-CM | POA: Diagnosis not present

## 2019-06-06 DIAGNOSIS — M79669 Pain in unspecified lower leg: Secondary | ICD-10-CM | POA: Diagnosis not present

## 2019-06-06 DIAGNOSIS — R591 Generalized enlarged lymph nodes: Secondary | ICD-10-CM | POA: Diagnosis not present

## 2019-08-13 ENCOUNTER — Ambulatory Visit
Admission: RE | Admit: 2019-08-13 | Discharge: 2019-08-13 | Discharge status: Absent without leave | Payer: BC Managed Care – PPO | Source: Ambulatory Visit | Attending: Family Medicine | Admitting: Family Medicine

## 2019-08-13 ENCOUNTER — Ambulatory Visit (INDEPENDENT_AMBULATORY_CARE_PROVIDER_SITE_OTHER)
Admission: EM | Admit: 2019-08-13 | Discharge: 2019-08-13 | Disposition: A | Discharge status: Home / Self Care | Payer: BC Managed Care – PPO | Source: Home / Self Care

## 2019-08-13 ENCOUNTER — Ambulatory Visit (HOSPITAL_COMMUNITY)
Admission: RE | Admit: 2019-08-13 | Discharge: 2019-08-13 | Disposition: A | Discharge status: Home / Self Care | Payer: BC Managed Care – PPO | Source: Ambulatory Visit | Attending: Emergency Medicine | Admitting: Emergency Medicine

## 2019-08-13 ENCOUNTER — Other Ambulatory Visit: Payer: Self-pay

## 2019-08-13 DIAGNOSIS — W19XXXA Unspecified fall, initial encounter: Secondary | ICD-10-CM | POA: Insufficient documentation

## 2019-08-13 DIAGNOSIS — M25462 Effusion, left knee: Secondary | ICD-10-CM | POA: Insufficient documentation

## 2019-08-13 DIAGNOSIS — M25562 Pain in left knee: Secondary | ICD-10-CM | POA: Diagnosis not present

## 2019-08-13 DIAGNOSIS — Y990 Civilian activity done for income or pay: Secondary | ICD-10-CM

## 2019-08-13 DIAGNOSIS — M1712 Unilateral primary osteoarthritis, left knee: Secondary | ICD-10-CM | POA: Diagnosis not present

## 2019-08-13 MED ORDER — NAPROXEN 500 MG PO TABS: 500.0000 mg | ORAL_TABLET | Freq: Two times a day (BID) | ORAL | 0 refills | Status: DC

## 2019-08-13 NOTE — Discharge Instructions (Signed)
X-rays ordered.  I will follow up with you regarding abnormal results Continue conservative management of rest, ice, and elevation Knee brace and crutches Take naproxen as needed for pain relief (may cause abdominal discomfort, ulcers, and GI bleeds avoid taking with other NSAIDs) Follow up with knee specialist for further evaluation and management Return or go to the ER if you have any new or worsening symptoms (fever, chills, chest pain, redness, swelling, bruising, deformity, etc...)

## 2019-08-13 NOTE — ED Provider Notes (Addendum)
[place]   [id number] 08/13/19 Arrival Time: 7253  CC: LT knee PAIN  SUBJECTIVE: History from: family. Brandon Norton is a 64 y.o. male complains of LT knee pain x 1 day.  Working in normal capacity at a Architect site.  Trip, twisted knee, and fall on LT knee.  Pain diffuse about the knee.  Describes the pain as intermittent and achy in character.  Denies alleviating factors.  Symptoms are made worse with walking.  Denies similar symptoms in the past.  Complains of associated swelling.  Denies fever, chills, erythema, ecchymosis, weakness, numbness and tingling  ROS: As per HPI.  All other pertinent ROS negative.     Past Medical History:  Diagnosis Date  . Cancer ([place])   . Diffuse large B cell lymphoma ([place]) 01/28/2012  . GERD (gastroesophageal reflux disease)   . Prostate cancer [facility])    Past Surgical History:  Procedure Laterality Date  . COLONOSCOPY    . COLONOSCOPY N/A 02/07/2018   Procedure: COLONOSCOPY;  Surgeon: Aviva Signs, MD;  Location[facility];  Service: Gastroenterology;  Laterality: N/A;  . HERNIA REPAIR Left   . KNEE ARTHROSCOPY WITH MEDIAL MENISECTOMY Right 05/18/2013   Procedure: RIGHT KNEE ARTHROSCOPY WITH PARTIAL MEDIAL MENISECTOMY AND MICROFRACTURE LATERAL FEMORAL CONDYLE;  Surgeon: Carole Civil, MD;  Location: [facility];  Service: Orthopedics;  Laterality: Right;  . porta cath    . VENTRAL HERNIA REPAIR N/A 02/10/2018   Procedure: VENTRAL HERNIORRHAPHY WITH MESH;  Surgeon: Aviva Signs, MD;  Location: [facility];  Service: General;  Laterality: N/A;   No Known Allergies No current facility-administered medications on file prior to encounter.   Current Outpatient Medications on File Prior to Encounter  Medication Sig Dispense Refill  . enzalutamide (XTANDI) 40 MG capsule Take 160 mg by mouth every evening.     . Tamsulosin HCl (FLOMAX) 0.4 MG CAPS Take 0.4 mg by mouth daily.      Social History   Socioeconomic History  . Marital  status: Married    Spouse name: Not on file  . Number of children: Not on file  . Years of education: Not on file  . Highest education level: Not on file  Occupational History  . Not on file  Tobacco Use  . Smoking status: Former Smoker    Years: 10.00    Types: Cigarettes    Quit date: 02/08/1989    Years since quitting: 30.5  . Smokeless tobacco: Never Used  . Tobacco comment: 2 cig daily  Vaping Use  . Vaping Use: Never used  Substance and Sexual Activity  . Alcohol use: No  . Drug use: No  . Sexual activity: Not on file  Other Topics Concern  . Not on file  Social History Narrative  . Not on file   Social Determinants of Health   Financial Resource Strain:   . Difficulty of Paying Living Expenses:   Food Insecurity:   . Worried About Charity fundraiser in the Last Year:   . Arboriculturist in the Last Year:   Transportation Needs:   . Film/video editor (Medical):   Marland Kitchen Lack of Transportation (Non-Medical):   Physical Activity:   . Days of Exercise per Week:   . Minutes of Exercise per Session:   Stress:   . Feeling of Stress :   Social Connections:   . Frequency of Communication with Friends and Family:   . Frequency of Social Gatherings with Friends  and Family:   . Attends Religious Services:   . Active Member of Clubs or Organizations:   . Attends Archivist Meetings:   Marland Kitchen Marital Status:   Intimate Partner Violence:   . Fear of Current or Ex-Partner:   . Emotionally Abused:   Marland Kitchen Physically Abused:   . Sexually Abused:    Family History  Problem Relation Age of Onset  . Colon cancer Neg Hx     OBJECTIVE:  Vitals:   08/13/19 1557  BP: 119/73  Pulse: 61  Resp: (!) 25  Temp: 98.8 F (37.1 C)  TempSrc: Oral  SpO2: 97%    General appearance: ALERT; in no acute distress.  Head: NCAT Lungs: Normal respiratory effort CV: Dorsalis pedis 2+ Musculoskeletal: LT knee Inspection: Swelling over medial aspect of knee Palpation: TTP over  medial joint line ROM: LROM Strength: 5/5 knee abduction, 5/5 knee adduction, 5/5 knee flexion, 5/5 knee extension Stability: Anterior/ posterior drawer intact Skin: warm and dry Neurologic: Ambulates without difficulty; Sensation intact about the lower extremities Psychological: alert and cooperative; normal mood and affect   ASSESSMENT & PLAN:  1. Acute pain of left knee   2. Pain and swelling of left knee   3. Work related injury   4. Fall, initial encounter     Meds ordered this encounter  Medications  . naproxen (NAPROSYN) 500 MG tablet    Sig: Take 1 tablet (500 mg total) by mouth 2 (two) times daily.    Dispense:  30 tablet    Refill:  0    Order Specific Question:   Supervising Provider    Answer:   Raylene Everts [[id number]]   X-rays ordered.  I will follow up with you regarding abnormal results Continue conservative management of rest, ice, and elevation Knee brace and crutches Take naproxen as needed for pain relief (may cause abdominal discomfort, ulcers, and GI bleeds avoid taking with other NSAIDs) Follow up with knee specialist for further evaluation and management Return or go to the ER if you have any new or worsening symptoms (fever, chills, chest pain, redness, swelling, bruising, deformity, etc...)   Reviewed expectations re: course of current medical issues. Questions answered. Outlined signs and symptoms indicating need for more acute intervention. Patient verbalized understanding. After Visit Summary given.  Addendum:   DIAGNOSTIC STUDIES:  DG Knee Complete 4 Views Left  Result Date: 08/13/2019 CLINICAL DATA:  Pain. EXAM: LEFT KNEE - COMPLETE 4+ VIEW COMPARISON:  April 04, 2019 FINDINGS: Again noted are moderate tricompartmental degenerative changes of the knee. There is a new moderate to large joint effusion. There is no definite acute displaced fracture or dislocation. IMPRESSION: 1. No acute displaced fracture or dislocation. 2. Moderate to large  joint effusion, new since recent prior study dated April 04, 2019. Electronically Signed   By: Constance Holster M.D.   On: 08/13/2019 18:03     X-rays negative for bony abnormalities including fracture, or dislocation.   I have reviewed the x-rays myself and the radiologist interpretation. I am in agreement with the radiologist interpretation.       Reviewed x-ray results with daughter.  Answered all questions.  Will follow up with ortho this week.     Lestine Box, PA-C 08/13/19 1823

## 2019-08-13 NOTE — ED Triage Notes (Signed)
Pt presents with left knee pain and swelling since this morning after he tripped with a tree he was cutting down at work and then fell on the ground. Pain is worse when walking. Pt took Aleve 4 hrs ago approx without relief.

## 2019-08-14 ENCOUNTER — Telehealth: Payer: Self-pay | Admitting: Orthopedic Surgery

## 2019-08-14 NOTE — Telephone Encounter (Signed)
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 Patient was treated at [facility] [place], had Xrays, and was fitted with a brace. Requesting appointment. States supervisor said to send bills and they will take care of - she states he said they don't have a list of certain doctors; aware patient sees Dr Aline Brochure.  I called supervisor for additional worker's comp insurer contact information - reached voice mail; left message. Appointment pending.

## 2019-08-15 NOTE — Telephone Encounter (Signed)
Per patient's daughter - provided human resources direct contact information, Kennedy Bucker, ph# [phone] / fax# [phone]. I called Mr. Mancel Bale, who relays that he was just made aware of patient/employee injury, and has filed a claim and no claim number as of yet. States patient went on his own to [facility] on Monday, 08/13/19. Faxed our worker's comp set up form; appointment pending. Patient aware we are awaiting this information.

## 2019-08-16 NOTE — Telephone Encounter (Signed)
Followed up on status with patient's employer [employer], as it came to attention that our fax was down yesterday and part of the morning today(08/16/19) with HR contact [place] - states no claim number yet from [employer]; states he has his contact [place] following up. Appointment still pending. Patient aware.

## 2019-08-20 NOTE — Telephone Encounter (Signed)
Call received from [employer]; per [employer], states "under worker's comp, he cannot choose his own doctor."  Relayed that we have not yet scheduled, per worker's comp protocol - claim number and authorization had not yet been received. States she will get back with [place]. Patient aware of status.

## 2019-08-21 NOTE — Telephone Encounter (Signed)
Per patient/designated party contact, no appointment to be scheduled at our clinic at this time. States per [employer], he is being referred to a worker's comp-approved urgent/occupational care.

## 2019-09-05 DIAGNOSIS — Z192 Hormone resistant malignancy status: Secondary | ICD-10-CM | POA: Diagnosis not present

## 2019-09-05 DIAGNOSIS — M25569 Pain in unspecified knee: Secondary | ICD-10-CM | POA: Diagnosis not present

## 2019-09-05 DIAGNOSIS — M13862 Other specified arthritis, left knee: Secondary | ICD-10-CM | POA: Diagnosis not present

## 2019-09-05 DIAGNOSIS — R9721 Rising PSA following treatment for malignant neoplasm of prostate: Secondary | ICD-10-CM | POA: Diagnosis not present

## 2019-09-05 DIAGNOSIS — M13861 Other specified arthritis, right knee: Secondary | ICD-10-CM | POA: Diagnosis not present

## 2019-09-05 DIAGNOSIS — M79661 Pain in right lower leg: Secondary | ICD-10-CM | POA: Diagnosis not present

## 2019-09-05 DIAGNOSIS — Z79818 Long term (current) use of other agents affecting estrogen receptors and estrogen levels: Secondary | ICD-10-CM | POA: Diagnosis not present

## 2019-09-05 DIAGNOSIS — Z923 Personal history of irradiation: Secondary | ICD-10-CM | POA: Diagnosis not present

## 2019-09-05 DIAGNOSIS — M25561 Pain in right knee: Secondary | ICD-10-CM | POA: Diagnosis not present

## 2019-09-05 DIAGNOSIS — Z9221 Personal history of antineoplastic chemotherapy: Secondary | ICD-10-CM | POA: Diagnosis not present

## 2019-09-05 DIAGNOSIS — C61 Malignant neoplasm of prostate: Secondary | ICD-10-CM | POA: Diagnosis not present

## 2019-09-05 DIAGNOSIS — K5900 Constipation, unspecified: Secondary | ICD-10-CM | POA: Diagnosis not present

## 2019-09-05 DIAGNOSIS — Z8572 Personal history of non-Hodgkin lymphomas: Secondary | ICD-10-CM | POA: Diagnosis not present

## 2019-10-08 ENCOUNTER — Ambulatory Visit (INDEPENDENT_AMBULATORY_CARE_PROVIDER_SITE_OTHER): Payer: BC Managed Care – PPO | Admitting: Orthopedic Surgery

## 2019-10-08 ENCOUNTER — Ambulatory Visit: Payer: BC Managed Care – PPO

## 2019-10-08 ENCOUNTER — Encounter: Payer: Self-pay | Admitting: Orthopedic Surgery

## 2019-10-08 ENCOUNTER — Other Ambulatory Visit: Payer: Self-pay

## 2019-10-08 VITALS — BP 127/80 | HR 69 | Ht 66.0 in | Wt 180.0 lb

## 2019-10-08 DIAGNOSIS — R252 Cramp and spasm: Secondary | ICD-10-CM

## 2019-10-08 DIAGNOSIS — M170 Bilateral primary osteoarthritis of knee: Secondary | ICD-10-CM

## 2019-10-08 DIAGNOSIS — G8929 Other chronic pain: Secondary | ICD-10-CM | POA: Diagnosis not present

## 2019-10-08 MED ORDER — NAPROXEN 500 MG PO TABS: 500.0000 mg | ORAL_TABLET | Freq: Two times a day (BID) | ORAL | 0 refills | Status: DC

## 2019-10-08 MED ORDER — TIZANIDINE HCL 4 MG PO TABS: 4.0000 mg | ORAL_TABLET | Freq: Every day | ORAL | 11 refills | Status: AC

## 2019-10-08 NOTE — Patient Instructions (Signed)
Continue naproxen  Drink plenty of fluids  Take tizanidine at night for cramping  Follow-up 6 months

## 2019-10-08 NOTE — Progress Notes (Signed)
Routine follow-up  Chief Complaint  Patient presents with  . Knee Pain    left/ has had a WC injury of the left knee since here last/     Encounter Diagnoses  Name Primary?  . Chronic pain of left knee Yes  . Primary osteoarthritis of both knees   . Muscle cramping     Assessment and plan  Brandon Norton was seen today for knee pain.  Diagnoses and all orders for this visit:  Chronic pain of left knee -     Cancel: DG Knee AP/LAT W/[place] Left; Future -     naproxen (NAPROSYN) 500 MG tablet; Take 1 tablet (500 mg total) by mouth 2 (two) times daily.  Primary osteoarthritis of both knees -     naproxen (NAPROSYN) 500 MG tablet; Take 1 tablet (500 mg total) by mouth 2 (two) times daily.  Muscle cramping -     tiZANidine (ZANAFLEX) 4 MG tablet; Take 1 tablet (4 mg total) by mouth at bedtime.    64 year old male with chronic knee pain from osteoarthritis currently in stable condition.  He was having some cramping at night 3 times a week probably so we put him on tizanidine.  His naproxen is controlling his pain he only takes it as needed we refilled  Follow-up 6 months for x-rays both knees   History this is a 64 year old male we have been following since 2015 he has osteoarthritis of both knees he is currently on naproxen doing well with that.  He did have an injury to his knee for which she was seen at [facility] they did inject his knee on the left and he recovered and went back to work  Currently not having significant symptoms  Exam Right knee Tenderness medial and lateral compartments nontender in the patellofemoral region He maintained his range of motion His knee is stable Muscle strength and tone are normal There is no effusion   Left knee is tender on the medial side nontender patellofemoral region Range of motion is maintained Knee is stable Joint is normal There is no effusion  Outside data notes dated August 24 [facility] and September 2  [facility] indicating patient's treatment course after twisting his ankle injuring his left knee.  Meds ordered this encounter  Medications  . tiZANidine (ZANAFLEX) 4 MG tablet    Sig: Take 1 tablet (4 mg total) by mouth at bedtime.    Dispense:  30 tablet    Refill:  11  . naproxen (NAPROSYN) 500 MG tablet    Sig: Take 1 tablet (500 mg total) by mouth 2 (two) times daily.    Dispense:  30 tablet    Refill:  0

## 2019-11-07 ENCOUNTER — Other Ambulatory Visit: Payer: Self-pay

## 2019-11-07 ENCOUNTER — Ambulatory Visit (HOSPITAL_COMMUNITY)
Admission: RE | Admit: 2019-11-07 | Discharge: 2019-11-07 | Disposition: A | Discharge status: Home / Self Care | Payer: BC Managed Care – PPO | Source: Ambulatory Visit | Attending: Internal Medicine | Admitting: Internal Medicine

## 2019-11-07 ENCOUNTER — Other Ambulatory Visit (HOSPITAL_COMMUNITY): Payer: Self-pay | Admitting: Internal Medicine

## 2019-11-07 DIAGNOSIS — R079 Chest pain, unspecified: Secondary | ICD-10-CM | POA: Diagnosis not present

## 2019-11-07 DIAGNOSIS — M47814 Spondylosis without myelopathy or radiculopathy, thoracic region: Secondary | ICD-10-CM | POA: Diagnosis not present

## 2019-11-07 DIAGNOSIS — Z23 Encounter for immunization: Secondary | ICD-10-CM | POA: Diagnosis not present

## 2019-11-07 DIAGNOSIS — J90 Pleural effusion, not elsewhere classified: Secondary | ICD-10-CM | POA: Diagnosis not present

## 2019-11-07 DIAGNOSIS — Z1389 Encounter for screening for other disorder: Secondary | ICD-10-CM | POA: Diagnosis not present

## 2019-11-07 DIAGNOSIS — R0789 Other chest pain: Secondary | ICD-10-CM | POA: Diagnosis not present

## 2019-11-07 DIAGNOSIS — M546 Pain in thoracic spine: Secondary | ICD-10-CM | POA: Diagnosis not present

## 2019-11-07 DIAGNOSIS — R0600 Dyspnea, unspecified: Secondary | ICD-10-CM | POA: Diagnosis not present

## 2019-11-07 DIAGNOSIS — Z6826 Body mass index (BMI) 26.0-26.9, adult: Secondary | ICD-10-CM | POA: Diagnosis not present

## 2019-11-07 DIAGNOSIS — S29019A Strain of muscle and tendon of unspecified wall of thorax, initial encounter: Secondary | ICD-10-CM | POA: Diagnosis not present

## 2019-11-07 DIAGNOSIS — E663 Overweight: Secondary | ICD-10-CM | POA: Diagnosis not present

## 2019-11-09 ENCOUNTER — Encounter: Payer: Self-pay | Admitting: Emergency Medicine

## 2019-11-09 ENCOUNTER — Ambulatory Visit
Admission: EM | Admit: 2019-11-09 | Discharge: 2019-11-09 | Disposition: A | Discharge status: Home / Self Care | Payer: BC Managed Care – PPO | Attending: Emergency Medicine | Admitting: Emergency Medicine

## 2019-11-09 DIAGNOSIS — S61501A Unspecified open wound of right wrist, initial encounter: Secondary | ICD-10-CM | POA: Diagnosis not present

## 2019-11-09 DIAGNOSIS — M25531 Pain in right wrist: Secondary | ICD-10-CM

## 2019-11-09 MED ORDER — MELOXICAM 15 MG PO TABS: 15.0000 mg | ORAL_TABLET | Freq: Every day | ORAL | 0 refills | Status: DC

## 2019-11-09 MED ORDER — MUPIROCIN 2 % EX OINT: 1.0000 "application " | TOPICAL_OINTMENT | Freq: Two times a day (BID) | CUTANEOUS | 0 refills | Status: DC

## 2019-11-09 NOTE — Discharge Instructions (Signed)
We will hold off on x-rays today.  Will return in 1-2 days if not having improvement Rest, ice and heat as needed Ace wrap and bandage applied to RT wrist Ensure adequate range of motion as tolerated. Injuries all appear to be muscular in nature at this time Prescribed mobic as needed for inflammation and pain relief.  DO NOT TAKE WITH OTHER antiinflammatories, as this may cause GI upset and/or bleed Prescribed bactroban ointment to prevent infection.   Expect some increased pain in the next 1-3 days.  It may take 3-4 weeks for complete resolution of symptoms Will f/u with his doctor or here if not seeing significant improvement within one week. Return here or go to ER if you have any new or worsening symptoms such as numbness/tingling of the inner thighs, loss of bladder or bowel control, headache/blurry vision, nausea/vomiting, confusion/altered mental status, dizziness, weakness, passing out, imbalance, etc..Marland Kitchen
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
## 2019-11-28 DIAGNOSIS — M170 Bilateral primary osteoarthritis of knee: Secondary | ICD-10-CM | POA: Diagnosis not present

## 2019-11-28 DIAGNOSIS — Z79899 Other long term (current) drug therapy: Secondary | ICD-10-CM | POA: Diagnosis not present

## 2019-11-28 DIAGNOSIS — Z192 Hormone resistant malignancy status: Secondary | ICD-10-CM | POA: Diagnosis not present

## 2019-11-28 DIAGNOSIS — Z8572 Personal history of non-Hodgkin lymphomas: Secondary | ICD-10-CM | POA: Diagnosis not present

## 2019-11-28 DIAGNOSIS — S22060D Wedge compression fracture of T7-T8 vertebra, subsequent encounter for fracture with routine healing: Secondary | ICD-10-CM | POA: Diagnosis not present

## 2019-11-28 DIAGNOSIS — C61 Malignant neoplasm of prostate: Secondary | ICD-10-CM | POA: Diagnosis not present

## 2019-11-28 DIAGNOSIS — S22000D Wedge compression fracture of unspecified thoracic vertebra, subsequent encounter for fracture with routine healing: Secondary | ICD-10-CM | POA: Insufficient documentation

## 2020-02-14 ENCOUNTER — Ambulatory Visit: Payer: BC Managed Care – PPO | Admitting: Cardiology

## 2020-02-27 DIAGNOSIS — Z79899 Other long term (current) drug therapy: Secondary | ICD-10-CM | POA: Diagnosis not present

## 2020-02-27 DIAGNOSIS — M25561 Pain in right knee: Secondary | ICD-10-CM | POA: Diagnosis not present

## 2020-02-27 DIAGNOSIS — Z923 Personal history of irradiation: Secondary | ICD-10-CM | POA: Diagnosis not present

## 2020-02-27 DIAGNOSIS — C61 Malignant neoplasm of prostate: Secondary | ICD-10-CM | POA: Diagnosis not present

## 2020-02-27 DIAGNOSIS — Z8572 Personal history of non-Hodgkin lymphomas: Secondary | ICD-10-CM | POA: Diagnosis not present

## 2020-02-27 DIAGNOSIS — M170 Bilateral primary osteoarthritis of knee: Secondary | ICD-10-CM | POA: Diagnosis not present

## 2020-03-26 DIAGNOSIS — E663 Overweight: Secondary | ICD-10-CM | POA: Diagnosis not present

## 2020-03-26 DIAGNOSIS — Z6827 Body mass index (BMI) 27.0-27.9, adult: Secondary | ICD-10-CM | POA: Diagnosis not present

## 2020-03-26 DIAGNOSIS — M7551 Bursitis of right shoulder: Secondary | ICD-10-CM | POA: Diagnosis not present

## 2020-03-26 DIAGNOSIS — M25811 Other specified joint disorders, right shoulder: Secondary | ICD-10-CM | POA: Diagnosis not present

## 2020-03-26 DIAGNOSIS — Z1331 Encounter for screening for depression: Secondary | ICD-10-CM | POA: Diagnosis not present

## 2020-04-07 ENCOUNTER — Ambulatory Visit (INDEPENDENT_AMBULATORY_CARE_PROVIDER_SITE_OTHER): Payer: BC Managed Care – PPO | Admitting: Orthopedic Surgery

## 2020-04-07 ENCOUNTER — Ambulatory Visit: Payer: BC Managed Care – PPO

## 2020-04-07 ENCOUNTER — Other Ambulatory Visit: Payer: Self-pay

## 2020-04-07 ENCOUNTER — Ambulatory Visit (INDEPENDENT_AMBULATORY_CARE_PROVIDER_SITE_OTHER): Payer: BC Managed Care – PPO

## 2020-04-07 ENCOUNTER — Encounter: Payer: Self-pay | Admitting: Orthopedic Surgery

## 2020-04-07 VITALS — BP 127/78 | HR 62 | Ht 65.0 in | Wt 172.2 lb

## 2020-04-07 DIAGNOSIS — M1712 Unilateral primary osteoarthritis, left knee: Secondary | ICD-10-CM | POA: Diagnosis not present

## 2020-04-07 DIAGNOSIS — G8929 Other chronic pain: Secondary | ICD-10-CM

## 2020-04-07 DIAGNOSIS — M1711 Unilateral primary osteoarthritis, right knee: Secondary | ICD-10-CM | POA: Diagnosis not present

## 2020-04-07 DIAGNOSIS — M25562 Pain in left knee: Secondary | ICD-10-CM | POA: Diagnosis not present

## 2020-04-07 DIAGNOSIS — M25561 Pain in right knee: Secondary | ICD-10-CM

## 2020-04-07 MED ORDER — MELOXICAM 15 MG PO TABS
15.0000 mg | ORAL_TABLET | Freq: Every day | ORAL | 0 refills | Status: DC
Start: 2020-04-07 — End: 2020-10-09

## 2020-04-07 NOTE — Progress Notes (Signed)
  Chief Complaint  Patient presents with  . Knee Pain    Bilateral/ L one hurts some/R is doing so so    Encounter Diagnoses  Name Primary?  . Chronic pain of left knee Yes  . 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 Cancer ([place])   . 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 GERD (gastroesophageal reflux disease)   . Prostate cancer ([facility])     BP 127/78   Pulse 62   Ht 5\' 5"  (1.651 m)   Wt 172 lb 4 oz (78.1 kg)   BMI 28.66 kg/m   Physical Exam Musculoskeletal:     Comments: He walks without support he has varus deformities of both knees minimal varus thrust  He has flexion contractures which are small in both knees his flexion is good on the right a little bit less on the left approximating 120 and 115 great degrees respectively there is no joint effusion but boggy synovium on the right both knees remained stable    Images show severe arthritis of both knees with varus deformities and severe medial joint space narrowing with mild to moderate disease in the remaining compartments  Procedure note bilateral knee injection with Celestone 6 mg and lidocaine 2 cc 1%  Patient gave permission his daughter is here with him we also got confirmation of the surgical sites right and left knee  We cleaned the skin with alcohol sprayed with ethyl chloride on and then injected through lateral approach on the right knee and repeated on the left knee  Meds ordered this encounter  Medications  . meloxicam (MOBIC) 15 MG tablet    Sig: Take 1 tablet (15 mg total) by mouth daily.    Dispense:  30 tablet    Refill:  0     He will continue with  meloxicam 15 mg daily follow-up in 6 months for x-rays

## 2020-05-27 DIAGNOSIS — S22060D Wedge compression fracture of T7-T8 vertebra, subsequent encounter for fracture with routine healing: Secondary | ICD-10-CM | POA: Diagnosis not present

## 2020-05-27 DIAGNOSIS — X58XXXD Exposure to other specified factors, subsequent encounter: Secondary | ICD-10-CM | POA: Diagnosis not present

## 2020-05-27 DIAGNOSIS — C7951 Secondary malignant neoplasm of bone: Secondary | ICD-10-CM | POA: Diagnosis not present

## 2020-05-27 DIAGNOSIS — S32010D Wedge compression fracture of first lumbar vertebra, subsequent encounter for fracture with routine healing: Secondary | ICD-10-CM | POA: Diagnosis not present

## 2020-05-27 DIAGNOSIS — S22070D Wedge compression fracture of T9-T10 vertebra, subsequent encounter for fracture with routine healing: Secondary | ICD-10-CM | POA: Diagnosis not present

## 2020-05-27 DIAGNOSIS — R918 Other nonspecific abnormal finding of lung field: Secondary | ICD-10-CM | POA: Diagnosis not present

## 2020-05-27 DIAGNOSIS — C61 Malignant neoplasm of prostate: Secondary | ICD-10-CM | POA: Diagnosis not present

## 2020-05-27 DIAGNOSIS — R1903 Right lower quadrant abdominal swelling, mass and lump: Secondary | ICD-10-CM | POA: Diagnosis not present

## 2020-05-27 DIAGNOSIS — S2232XD Fracture of one rib, left side, subsequent encounter for fracture with routine healing: Secondary | ICD-10-CM | POA: Diagnosis not present

## 2020-05-29 DIAGNOSIS — Z192 Hormone resistant malignancy status: Secondary | ICD-10-CM | POA: Diagnosis not present

## 2020-05-29 DIAGNOSIS — M170 Bilateral primary osteoarthritis of knee: Secondary | ICD-10-CM | POA: Diagnosis not present

## 2020-05-29 DIAGNOSIS — S22068D Other fracture of T7-T8 thoracic vertebra, subsequent encounter for fracture with routine healing: Secondary | ICD-10-CM | POA: Diagnosis not present

## 2020-05-29 DIAGNOSIS — S22088D Other fracture of T11-T12 vertebra, subsequent encounter for fracture with routine healing: Secondary | ICD-10-CM | POA: Diagnosis not present

## 2020-05-29 DIAGNOSIS — S22060D Wedge compression fracture of T7-T8 vertebra, subsequent encounter for fracture with routine healing: Secondary | ICD-10-CM | POA: Diagnosis not present

## 2020-05-29 DIAGNOSIS — C61 Malignant neoplasm of prostate: Secondary | ICD-10-CM | POA: Diagnosis not present

## 2020-05-29 DIAGNOSIS — Z5111 Encounter for antineoplastic chemotherapy: Secondary | ICD-10-CM | POA: Diagnosis not present

## 2020-05-29 DIAGNOSIS — R232 Flushing: Secondary | ICD-10-CM | POA: Diagnosis not present

## 2020-05-29 DIAGNOSIS — Z79899 Other long term (current) drug therapy: Secondary | ICD-10-CM | POA: Diagnosis not present

## 2020-05-29 DIAGNOSIS — W19XXXD Unspecified fall, subsequent encounter: Secondary | ICD-10-CM | POA: Diagnosis not present

## 2020-05-29 DIAGNOSIS — M79669 Pain in unspecified lower leg: Secondary | ICD-10-CM | POA: Diagnosis not present

## 2020-05-29 DIAGNOSIS — Z9889 Other specified postprocedural states: Secondary | ICD-10-CM | POA: Diagnosis not present

## 2020-05-29 DIAGNOSIS — Z8572 Personal history of non-Hodgkin lymphomas: Secondary | ICD-10-CM | POA: Diagnosis not present

## 2020-06-04 DIAGNOSIS — S39012A Strain of muscle, fascia and tendon of lower back, initial encounter: Secondary | ICD-10-CM | POA: Diagnosis not present

## 2020-06-04 DIAGNOSIS — Z6827 Body mass index (BMI) 27.0-27.9, adult: Secondary | ICD-10-CM | POA: Diagnosis not present

## 2020-06-04 DIAGNOSIS — E663 Overweight: Secondary | ICD-10-CM | POA: Diagnosis not present

## 2020-06-04 DIAGNOSIS — M5136 Other intervertebral disc degeneration, lumbar region: Secondary | ICD-10-CM | POA: Diagnosis not present

## 2020-07-04 DIAGNOSIS — E663 Overweight: Secondary | ICD-10-CM | POA: Diagnosis not present

## 2020-07-04 DIAGNOSIS — Z23 Encounter for immunization: Secondary | ICD-10-CM | POA: Diagnosis not present

## 2020-07-04 DIAGNOSIS — M170 Bilateral primary osteoarthritis of knee: Secondary | ICD-10-CM | POA: Diagnosis not present

## 2020-07-04 DIAGNOSIS — Z0001 Encounter for general adult medical examination with abnormal findings: Secondary | ICD-10-CM | POA: Diagnosis not present

## 2020-07-04 DIAGNOSIS — Z6826 Body mass index (BMI) 26.0-26.9, adult: Secondary | ICD-10-CM | POA: Diagnosis not present

## 2020-07-04 DIAGNOSIS — Z1331 Encounter for screening for depression: Secondary | ICD-10-CM | POA: Diagnosis not present

## 2020-07-04 DIAGNOSIS — C61 Malignant neoplasm of prostate: Secondary | ICD-10-CM | POA: Diagnosis not present

## 2020-07-11 DIAGNOSIS — R739 Hyperglycemia, unspecified: Secondary | ICD-10-CM | POA: Diagnosis not present

## 2020-07-11 DIAGNOSIS — E663 Overweight: Secondary | ICD-10-CM | POA: Diagnosis not present

## 2020-07-11 DIAGNOSIS — Z6826 Body mass index (BMI) 26.0-26.9, adult: Secondary | ICD-10-CM | POA: Diagnosis not present

## 2020-07-11 DIAGNOSIS — Z1389 Encounter for screening for other disorder: Secondary | ICD-10-CM | POA: Diagnosis not present

## 2020-08-27 DIAGNOSIS — Z8572 Personal history of non-Hodgkin lymphomas: Secondary | ICD-10-CM | POA: Diagnosis not present

## 2020-08-27 DIAGNOSIS — Z923 Personal history of irradiation: Secondary | ICD-10-CM | POA: Diagnosis not present

## 2020-08-27 DIAGNOSIS — M25562 Pain in left knee: Secondary | ICD-10-CM | POA: Diagnosis not present

## 2020-08-27 DIAGNOSIS — M4854XA Collapsed vertebra, not elsewhere classified, thoracic region, initial encounter for fracture: Secondary | ICD-10-CM | POA: Diagnosis not present

## 2020-08-27 DIAGNOSIS — X58XXXA Exposure to other specified factors, initial encounter: Secondary | ICD-10-CM | POA: Diagnosis not present

## 2020-08-27 DIAGNOSIS — Z79899 Other long term (current) drug therapy: Secondary | ICD-10-CM | POA: Diagnosis not present

## 2020-08-27 DIAGNOSIS — C61 Malignant neoplasm of prostate: Secondary | ICD-10-CM | POA: Diagnosis not present

## 2020-09-17 DIAGNOSIS — C61 Malignant neoplasm of prostate: Secondary | ICD-10-CM | POA: Diagnosis not present

## 2020-09-17 DIAGNOSIS — R590 Localized enlarged lymph nodes: Secondary | ICD-10-CM | POA: Diagnosis not present

## 2020-09-29 DIAGNOSIS — C61 Malignant neoplasm of prostate: Secondary | ICD-10-CM | POA: Diagnosis not present

## 2020-10-08 DIAGNOSIS — C61 Malignant neoplasm of prostate: Secondary | ICD-10-CM | POA: Diagnosis not present

## 2020-10-09 ENCOUNTER — Encounter: Payer: Self-pay | Admitting: Orthopedic Surgery

## 2020-10-09 ENCOUNTER — Other Ambulatory Visit: Payer: Self-pay

## 2020-10-09 ENCOUNTER — Ambulatory Visit: Payer: BC Managed Care – PPO

## 2020-10-09 ENCOUNTER — Ambulatory Visit (INDEPENDENT_AMBULATORY_CARE_PROVIDER_SITE_OTHER): Payer: BC Managed Care – PPO | Admitting: Orthopedic Surgery

## 2020-10-09 VITALS — BP 150/101 | HR 68 | Ht 65.0 in | Wt 170.0 lb

## 2020-10-09 DIAGNOSIS — M170 Bilateral primary osteoarthritis of knee: Secondary | ICD-10-CM | POA: Diagnosis not present

## 2020-10-09 MED ORDER — MELOXICAM 15 MG PO TABS: 15.0000 mg | ORAL_TABLET | Freq: Every day | ORAL | 0 refills | Status: DC

## 2020-10-09 NOTE — Progress Notes (Signed)
Chief Complaint  Patient presents with   Injections    Wants injections both knees states they both are painful injections helped some   Brandon Norton is here for injections in both knees and he wants his Mobic refilled  He has significant pain at night he does well during the day he says he can do most of his activities.  He thinks his Mobic is helping.  He would like to injections 1 in each knee  We decided to use Celestone 6 mg and 4 cc 1% lidocaine  Patient is in for bilateral knee injections  Skin was cleaned with ethyl chloride and alcohol injected with Celestone and 4 cc 1% lidocaine.  This was done with the knee in flexion and from a lateral approach we started with the right knee and then repeated the left knee  We refilled his Mobic  We will follow-up in a year for repeat x-rays  Addendum x-rays today show no progression of his arthritic changes on x-ray  Meds ordered this encounter  Medications   meloxicam (MOBIC) 15 MG tablet    Sig: Take 1 tablet (15 mg total) by mouth daily.    Dispense:  30 tablet    Refill:  0

## 2020-10-09 NOTE — Patient Instructions (Signed)
we x-rayed both knees today and the arthritis has not shown any major progression  We refilled his Mobic  We gave him 2 injections

## 2020-10-29 DIAGNOSIS — Z192 Hormone resistant malignancy status: Secondary | ICD-10-CM | POA: Diagnosis not present

## 2020-10-29 DIAGNOSIS — C61 Malignant neoplasm of prostate: Secondary | ICD-10-CM | POA: Diagnosis not present

## 2020-10-29 DIAGNOSIS — Z5111 Encounter for antineoplastic chemotherapy: Secondary | ICD-10-CM | POA: Diagnosis not present

## 2020-11-07 DIAGNOSIS — D84821 Immunodeficiency due to drugs: Secondary | ICD-10-CM | POA: Diagnosis not present

## 2020-11-07 DIAGNOSIS — E876 Hypokalemia: Secondary | ICD-10-CM | POA: Diagnosis not present

## 2020-11-07 DIAGNOSIS — Z923 Personal history of irradiation: Secondary | ICD-10-CM | POA: Diagnosis not present

## 2020-11-07 DIAGNOSIS — K219 Gastro-esophageal reflux disease without esophagitis: Secondary | ICD-10-CM | POA: Diagnosis not present

## 2020-11-07 DIAGNOSIS — Z7960 Long term (current) use of unspecified immunomodulators and immunosuppressants: Secondary | ICD-10-CM | POA: Diagnosis not present

## 2020-11-07 DIAGNOSIS — Z9221 Personal history of antineoplastic chemotherapy: Secondary | ICD-10-CM | POA: Diagnosis not present

## 2020-11-07 DIAGNOSIS — E861 Hypovolemia: Secondary | ICD-10-CM | POA: Diagnosis not present

## 2020-11-07 DIAGNOSIS — R1310 Dysphagia, unspecified: Secondary | ICD-10-CM | POA: Diagnosis not present

## 2020-11-07 DIAGNOSIS — R109 Unspecified abdominal pain: Secondary | ICD-10-CM | POA: Diagnosis not present

## 2020-11-07 DIAGNOSIS — Z8546 Personal history of malignant neoplasm of prostate: Secondary | ICD-10-CM | POA: Diagnosis not present

## 2020-11-07 DIAGNOSIS — K625 Hemorrhage of anus and rectum: Secondary | ICD-10-CM | POA: Diagnosis not present

## 2020-11-07 DIAGNOSIS — R0602 Shortness of breath: Secondary | ICD-10-CM | POA: Diagnosis not present

## 2020-11-07 DIAGNOSIS — D62 Acute posthemorrhagic anemia: Secondary | ICD-10-CM | POA: Diagnosis not present

## 2020-11-07 DIAGNOSIS — K529 Noninfective gastroenteritis and colitis, unspecified: Secondary | ICD-10-CM | POA: Diagnosis not present

## 2020-11-07 DIAGNOSIS — C61 Malignant neoplasm of prostate: Secondary | ICD-10-CM | POA: Diagnosis not present

## 2020-11-07 DIAGNOSIS — D709 Neutropenia, unspecified: Secondary | ICD-10-CM | POA: Diagnosis not present

## 2020-11-07 DIAGNOSIS — Z8572 Personal history of non-Hodgkin lymphomas: Secondary | ICD-10-CM | POA: Diagnosis not present

## 2020-11-07 DIAGNOSIS — R1084 Generalized abdominal pain: Secondary | ICD-10-CM | POA: Diagnosis not present

## 2020-11-07 DIAGNOSIS — K8020 Calculus of gallbladder without cholecystitis without obstruction: Secondary | ICD-10-CM | POA: Diagnosis not present

## 2020-11-07 DIAGNOSIS — D508 Other iron deficiency anemias: Secondary | ICD-10-CM | POA: Diagnosis not present

## 2020-11-07 DIAGNOSIS — K922 Gastrointestinal hemorrhage, unspecified: Secondary | ICD-10-CM | POA: Diagnosis not present

## 2020-11-07 DIAGNOSIS — K921 Melena: Secondary | ICD-10-CM | POA: Diagnosis not present

## 2020-11-07 DIAGNOSIS — R1319 Other dysphagia: Secondary | ICD-10-CM | POA: Diagnosis not present

## 2020-11-07 DIAGNOSIS — Z803 Family history of malignant neoplasm of breast: Secondary | ICD-10-CM | POA: Diagnosis not present

## 2020-11-09 DIAGNOSIS — D6489 Other specified anemias: Secondary | ICD-10-CM | POA: Insufficient documentation

## 2020-11-19 DIAGNOSIS — Z79899 Other long term (current) drug therapy: Secondary | ICD-10-CM | POA: Diagnosis not present

## 2020-11-19 DIAGNOSIS — M170 Bilateral primary osteoarthritis of knee: Secondary | ICD-10-CM | POA: Diagnosis not present

## 2020-11-19 DIAGNOSIS — Z192 Hormone resistant malignancy status: Secondary | ICD-10-CM | POA: Diagnosis not present

## 2020-11-19 DIAGNOSIS — S22089A Unspecified fracture of T11-T12 vertebra, initial encounter for closed fracture: Secondary | ICD-10-CM | POA: Diagnosis not present

## 2020-11-19 DIAGNOSIS — S22069A Unspecified fracture of T7-T8 vertebra, initial encounter for closed fracture: Secondary | ICD-10-CM | POA: Diagnosis not present

## 2020-11-19 DIAGNOSIS — Z8572 Personal history of non-Hodgkin lymphomas: Secondary | ICD-10-CM | POA: Diagnosis not present

## 2020-11-19 DIAGNOSIS — C61 Malignant neoplasm of prostate: Secondary | ICD-10-CM | POA: Diagnosis not present

## 2020-11-19 DIAGNOSIS — K921 Melena: Secondary | ICD-10-CM | POA: Diagnosis not present

## 2020-11-19 DIAGNOSIS — M4854XA Collapsed vertebra, not elsewhere classified, thoracic region, initial encounter for fracture: Secondary | ICD-10-CM | POA: Diagnosis not present

## 2020-11-25 DIAGNOSIS — K627 Radiation proctitis: Secondary | ICD-10-CM | POA: Diagnosis not present

## 2020-11-25 DIAGNOSIS — K921 Melena: Secondary | ICD-10-CM | POA: Diagnosis not present

## 2020-11-25 DIAGNOSIS — Y842 Radiological procedure and radiotherapy as the cause of abnormal reaction of the patient, or of later complication, without mention of misadventure at the time of the procedure: Secondary | ICD-10-CM | POA: Diagnosis not present

## 2020-11-25 DIAGNOSIS — K5730 Diverticulosis of large intestine without perforation or abscess without bleeding: Secondary | ICD-10-CM | POA: Diagnosis not present

## 2020-11-26 DIAGNOSIS — M170 Bilateral primary osteoarthritis of knee: Secondary | ICD-10-CM | POA: Diagnosis not present

## 2020-11-26 DIAGNOSIS — R059 Cough, unspecified: Secondary | ICD-10-CM | POA: Diagnosis not present

## 2020-11-26 DIAGNOSIS — Z5111 Encounter for antineoplastic chemotherapy: Secondary | ICD-10-CM | POA: Diagnosis not present

## 2020-11-26 DIAGNOSIS — Z192 Hormone resistant malignancy status: Secondary | ICD-10-CM | POA: Diagnosis not present

## 2020-11-26 DIAGNOSIS — K921 Melena: Secondary | ICD-10-CM | POA: Diagnosis not present

## 2020-11-26 DIAGNOSIS — S22080D Wedge compression fracture of T11-T12 vertebra, subsequent encounter for fracture with routine healing: Secondary | ICD-10-CM | POA: Diagnosis not present

## 2020-11-26 DIAGNOSIS — M79669 Pain in unspecified lower leg: Secondary | ICD-10-CM | POA: Diagnosis not present

## 2020-11-26 DIAGNOSIS — Z9181 History of falling: Secondary | ICD-10-CM | POA: Diagnosis not present

## 2020-11-26 DIAGNOSIS — C61 Malignant neoplasm of prostate: Secondary | ICD-10-CM | POA: Diagnosis not present

## 2020-11-26 DIAGNOSIS — R0602 Shortness of breath: Secondary | ICD-10-CM | POA: Diagnosis not present

## 2020-11-26 DIAGNOSIS — Z8572 Personal history of non-Hodgkin lymphomas: Secondary | ICD-10-CM | POA: Diagnosis not present

## 2020-11-26 DIAGNOSIS — M4854XA Collapsed vertebra, not elsewhere classified, thoracic region, initial encounter for fracture: Secondary | ICD-10-CM | POA: Diagnosis not present

## 2020-11-26 DIAGNOSIS — Z79899 Other long term (current) drug therapy: Secondary | ICD-10-CM | POA: Diagnosis not present

## 2020-11-26 DIAGNOSIS — T451X5A Adverse effect of antineoplastic and immunosuppressive drugs, initial encounter: Secondary | ICD-10-CM | POA: Diagnosis not present

## 2020-11-26 DIAGNOSIS — R232 Flushing: Secondary | ICD-10-CM | POA: Diagnosis not present

## 2020-12-24 DIAGNOSIS — C61 Malignant neoplasm of prostate: Secondary | ICD-10-CM | POA: Diagnosis not present

## 2020-12-31 DIAGNOSIS — S22080D Wedge compression fracture of T11-T12 vertebra, subsequent encounter for fracture with routine healing: Secondary | ICD-10-CM | POA: Diagnosis not present

## 2020-12-31 DIAGNOSIS — W19XXXD Unspecified fall, subsequent encounter: Secondary | ICD-10-CM | POA: Diagnosis not present

## 2020-12-31 DIAGNOSIS — C61 Malignant neoplasm of prostate: Secondary | ICD-10-CM | POA: Diagnosis not present

## 2020-12-31 DIAGNOSIS — S22060D Wedge compression fracture of T7-T8 vertebra, subsequent encounter for fracture with routine healing: Secondary | ICD-10-CM | POA: Diagnosis not present

## 2020-12-31 DIAGNOSIS — M25562 Pain in left knee: Secondary | ICD-10-CM | POA: Diagnosis not present

## 2020-12-31 DIAGNOSIS — M25561 Pain in right knee: Secondary | ICD-10-CM | POA: Diagnosis not present

## 2020-12-31 DIAGNOSIS — M79661 Pain in right lower leg: Secondary | ICD-10-CM | POA: Diagnosis not present

## 2020-12-31 DIAGNOSIS — Z192 Hormone resistant malignancy status: Secondary | ICD-10-CM | POA: Diagnosis not present

## 2020-12-31 DIAGNOSIS — S22069D Unspecified fracture of T7-T8 vertebra, subsequent encounter for fracture with routine healing: Secondary | ICD-10-CM | POA: Diagnosis not present

## 2020-12-31 DIAGNOSIS — Z8572 Personal history of non-Hodgkin lymphomas: Secondary | ICD-10-CM | POA: Diagnosis not present

## 2020-12-31 DIAGNOSIS — M170 Bilateral primary osteoarthritis of knee: Secondary | ICD-10-CM | POA: Diagnosis not present

## 2020-12-31 DIAGNOSIS — S22089D Unspecified fracture of T11-T12 vertebra, subsequent encounter for fracture with routine healing: Secondary | ICD-10-CM | POA: Diagnosis not present

## 2020-12-31 DIAGNOSIS — K625 Hemorrhage of anus and rectum: Secondary | ICD-10-CM | POA: Diagnosis not present

## 2020-12-31 DIAGNOSIS — M79662 Pain in left lower leg: Secondary | ICD-10-CM | POA: Diagnosis not present

## 2020-12-31 DIAGNOSIS — Z9221 Personal history of antineoplastic chemotherapy: Secondary | ICD-10-CM | POA: Diagnosis not present

## 2020-12-31 DIAGNOSIS — G8929 Other chronic pain: Secondary | ICD-10-CM | POA: Diagnosis not present

## 2021-01-14 DIAGNOSIS — W19XXXD Unspecified fall, subsequent encounter: Secondary | ICD-10-CM | POA: Diagnosis not present

## 2021-01-14 DIAGNOSIS — Z856 Personal history of leukemia: Secondary | ICD-10-CM | POA: Diagnosis not present

## 2021-01-14 DIAGNOSIS — C61 Malignant neoplasm of prostate: Secondary | ICD-10-CM | POA: Diagnosis not present

## 2021-01-14 DIAGNOSIS — S22069D Unspecified fracture of T7-T8 vertebra, subsequent encounter for fracture with routine healing: Secondary | ICD-10-CM | POA: Diagnosis not present

## 2021-01-14 DIAGNOSIS — S22089D Unspecified fracture of T11-T12 vertebra, subsequent encounter for fracture with routine healing: Secondary | ICD-10-CM | POA: Diagnosis not present

## 2021-01-14 DIAGNOSIS — G8929 Other chronic pain: Secondary | ICD-10-CM | POA: Diagnosis not present

## 2021-01-14 DIAGNOSIS — M25569 Pain in unspecified knee: Secondary | ICD-10-CM | POA: Diagnosis not present

## 2021-01-14 DIAGNOSIS — M79606 Pain in leg, unspecified: Secondary | ICD-10-CM | POA: Diagnosis not present

## 2021-01-14 DIAGNOSIS — K921 Melena: Secondary | ICD-10-CM | POA: Diagnosis not present

## 2021-02-04 DIAGNOSIS — R590 Localized enlarged lymph nodes: Secondary | ICD-10-CM | POA: Diagnosis not present

## 2021-02-04 DIAGNOSIS — C770 Secondary and unspecified malignant neoplasm of lymph nodes of head, face and neck: Secondary | ICD-10-CM | POA: Diagnosis not present

## 2021-02-04 DIAGNOSIS — C61 Malignant neoplasm of prostate: Secondary | ICD-10-CM | POA: Diagnosis not present

## 2021-02-09 DIAGNOSIS — H401131 Primary open-angle glaucoma, bilateral, mild stage: Secondary | ICD-10-CM | POA: Diagnosis not present

## 2021-02-09 DIAGNOSIS — H2513 Age-related nuclear cataract, bilateral: Secondary | ICD-10-CM | POA: Diagnosis not present

## 2021-02-09 DIAGNOSIS — H01001 Unspecified blepharitis right upper eyelid: Secondary | ICD-10-CM | POA: Diagnosis not present

## 2021-02-09 DIAGNOSIS — H18413 Arcus senilis, bilateral: Secondary | ICD-10-CM | POA: Diagnosis not present

## 2021-02-11 DIAGNOSIS — C61 Malignant neoplasm of prostate: Secondary | ICD-10-CM | POA: Diagnosis not present

## 2021-02-11 DIAGNOSIS — Z923 Personal history of irradiation: Secondary | ICD-10-CM | POA: Diagnosis not present

## 2021-02-11 DIAGNOSIS — Z8572 Personal history of non-Hodgkin lymphomas: Secondary | ICD-10-CM | POA: Diagnosis not present

## 2021-02-11 DIAGNOSIS — M25562 Pain in left knee: Secondary | ICD-10-CM | POA: Diagnosis not present

## 2021-02-11 DIAGNOSIS — M79606 Pain in leg, unspecified: Secondary | ICD-10-CM | POA: Diagnosis not present

## 2021-02-11 DIAGNOSIS — Z192 Hormone resistant malignancy status: Secondary | ICD-10-CM | POA: Diagnosis not present

## 2021-02-11 DIAGNOSIS — M25561 Pain in right knee: Secondary | ICD-10-CM | POA: Diagnosis not present

## 2021-02-11 DIAGNOSIS — W19XXXD Unspecified fall, subsequent encounter: Secondary | ICD-10-CM | POA: Diagnosis not present

## 2021-02-11 DIAGNOSIS — Z79818 Long term (current) use of other agents affecting estrogen receptors and estrogen levels: Secondary | ICD-10-CM | POA: Diagnosis not present

## 2021-02-11 DIAGNOSIS — Z79899 Other long term (current) drug therapy: Secondary | ICD-10-CM | POA: Diagnosis not present

## 2021-02-11 DIAGNOSIS — M4854XD Collapsed vertebra, not elsewhere classified, thoracic region, subsequent encounter for fracture with routine healing: Secondary | ICD-10-CM | POA: Diagnosis not present

## 2021-02-11 DIAGNOSIS — R590 Localized enlarged lymph nodes: Secondary | ICD-10-CM | POA: Diagnosis not present

## 2021-02-18 DIAGNOSIS — Z192 Hormone resistant malignancy status: Secondary | ICD-10-CM | POA: Diagnosis not present

## 2021-02-18 DIAGNOSIS — C7951 Secondary malignant neoplasm of bone: Secondary | ICD-10-CM | POA: Diagnosis not present

## 2021-02-18 DIAGNOSIS — C61 Malignant neoplasm of prostate: Secondary | ICD-10-CM | POA: Diagnosis not present

## 2021-02-24 DIAGNOSIS — C61 Malignant neoplasm of prostate: Secondary | ICD-10-CM | POA: Diagnosis not present

## 2021-03-11 DIAGNOSIS — Z79899 Other long term (current) drug therapy: Secondary | ICD-10-CM | POA: Diagnosis not present

## 2021-03-11 DIAGNOSIS — M25561 Pain in right knee: Secondary | ICD-10-CM | POA: Diagnosis not present

## 2021-03-11 DIAGNOSIS — Z79818 Long term (current) use of other agents affecting estrogen receptors and estrogen levels: Secondary | ICD-10-CM | POA: Diagnosis not present

## 2021-03-11 DIAGNOSIS — Z923 Personal history of irradiation: Secondary | ICD-10-CM | POA: Diagnosis not present

## 2021-03-11 DIAGNOSIS — C61 Malignant neoplasm of prostate: Secondary | ICD-10-CM | POA: Diagnosis not present

## 2021-03-11 DIAGNOSIS — Z192 Hormone resistant malignancy status: Secondary | ICD-10-CM | POA: Diagnosis not present

## 2021-03-11 DIAGNOSIS — Z8572 Personal history of non-Hodgkin lymphomas: Secondary | ICD-10-CM | POA: Diagnosis not present

## 2021-03-11 DIAGNOSIS — M25562 Pain in left knee: Secondary | ICD-10-CM | POA: Diagnosis not present

## 2021-03-11 DIAGNOSIS — M79606 Pain in leg, unspecified: Secondary | ICD-10-CM | POA: Diagnosis not present

## 2021-03-11 DIAGNOSIS — M4854XD Collapsed vertebra, not elsewhere classified, thoracic region, subsequent encounter for fracture with routine healing: Secondary | ICD-10-CM | POA: Diagnosis not present

## 2021-03-16 DIAGNOSIS — H01001 Unspecified blepharitis right upper eyelid: Secondary | ICD-10-CM | POA: Diagnosis not present

## 2021-03-16 DIAGNOSIS — H401131 Primary open-angle glaucoma, bilateral, mild stage: Secondary | ICD-10-CM | POA: Diagnosis not present

## 2021-03-16 DIAGNOSIS — H18413 Arcus senilis, bilateral: Secondary | ICD-10-CM | POA: Diagnosis not present

## 2021-03-16 DIAGNOSIS — H2513 Age-related nuclear cataract, bilateral: Secondary | ICD-10-CM | POA: Diagnosis not present

## 2021-03-18 DIAGNOSIS — C61 Malignant neoplasm of prostate: Secondary | ICD-10-CM | POA: Diagnosis not present

## 2021-04-01 DIAGNOSIS — C7951 Secondary malignant neoplasm of bone: Secondary | ICD-10-CM | POA: Diagnosis not present

## 2021-04-01 DIAGNOSIS — C61 Malignant neoplasm of prostate: Secondary | ICD-10-CM | POA: Diagnosis not present

## 2021-05-01 DIAGNOSIS — C61 Malignant neoplasm of prostate: Secondary | ICD-10-CM | POA: Diagnosis not present

## 2021-05-01 DIAGNOSIS — C7951 Secondary malignant neoplasm of bone: Secondary | ICD-10-CM | POA: Diagnosis not present

## 2021-07-17 DIAGNOSIS — C7951 Secondary malignant neoplasm of bone: Secondary | ICD-10-CM | POA: Diagnosis not present

## 2021-08-12 DIAGNOSIS — C7951 Secondary malignant neoplasm of bone: Secondary | ICD-10-CM | POA: Diagnosis not present

## 2021-08-14 DIAGNOSIS — C7951 Secondary malignant neoplasm of bone: Secondary | ICD-10-CM | POA: Diagnosis not present

## 2021-08-28 DIAGNOSIS — C7951 Secondary malignant neoplasm of bone: Secondary | ICD-10-CM | POA: Diagnosis not present

## 2021-08-28 DIAGNOSIS — Z79899 Other long term (current) drug therapy: Secondary | ICD-10-CM | POA: Diagnosis not present

## 2021-09-22 DIAGNOSIS — C7951 Secondary malignant neoplasm of bone: Secondary | ICD-10-CM | POA: Diagnosis not present

## 2021-09-24 DIAGNOSIS — C7951 Secondary malignant neoplasm of bone: Secondary | ICD-10-CM | POA: Diagnosis not present

## 2021-10-08 ENCOUNTER — Ambulatory Visit: Payer: BC Managed Care – PPO | Admitting: Orthopedic Surgery

## 2021-10-09 DIAGNOSIS — C7951 Secondary malignant neoplasm of bone: Secondary | ICD-10-CM | POA: Diagnosis not present

## 2021-10-14 DIAGNOSIS — Z87311 Personal history of (healed) other pathological fracture: Secondary | ICD-10-CM | POA: Diagnosis not present

## 2021-10-14 DIAGNOSIS — M170 Bilateral primary osteoarthritis of knee: Secondary | ICD-10-CM | POA: Diagnosis not present

## 2021-10-14 DIAGNOSIS — Z923 Personal history of irradiation: Secondary | ICD-10-CM | POA: Diagnosis not present

## 2021-10-14 DIAGNOSIS — C7951 Secondary malignant neoplasm of bone: Secondary | ICD-10-CM | POA: Diagnosis not present

## 2021-10-14 DIAGNOSIS — M25569 Pain in unspecified knee: Secondary | ICD-10-CM | POA: Diagnosis not present

## 2021-10-14 DIAGNOSIS — Z9221 Personal history of antineoplastic chemotherapy: Secondary | ICD-10-CM | POA: Diagnosis not present

## 2021-10-14 DIAGNOSIS — Z9889 Other specified postprocedural states: Secondary | ICD-10-CM | POA: Diagnosis not present

## 2021-10-14 DIAGNOSIS — M79669 Pain in unspecified lower leg: Secondary | ICD-10-CM | POA: Diagnosis not present

## 2021-10-16 ENCOUNTER — Ambulatory Visit (INDEPENDENT_AMBULATORY_CARE_PROVIDER_SITE_OTHER): Payer: Medicare Other

## 2021-10-16 ENCOUNTER — Encounter: Payer: Self-pay | Admitting: Orthopedic Surgery

## 2021-10-16 ENCOUNTER — Ambulatory Visit: Payer: Medicare Other | Admitting: Orthopedic Surgery

## 2021-10-16 ENCOUNTER — Ambulatory Visit: Payer: Medicare Other

## 2021-10-16 VITALS — Ht 64.0 in | Wt 169.4 lb

## 2021-10-16 DIAGNOSIS — G8929 Other chronic pain: Secondary | ICD-10-CM

## 2021-10-16 DIAGNOSIS — M25562 Pain in left knee: Secondary | ICD-10-CM

## 2021-10-16 DIAGNOSIS — M170 Bilateral primary osteoarthritis of knee: Secondary | ICD-10-CM | POA: Diagnosis not present

## 2021-10-16 DIAGNOSIS — M25561 Pain in right knee: Secondary | ICD-10-CM | POA: Diagnosis not present

## 2021-10-16 DIAGNOSIS — B351 Tinea unguium: Secondary | ICD-10-CM | POA: Diagnosis not present

## 2021-10-16 MED ORDER — MELOXICAM 15 MG PO TABS: 15.0000 mg | ORAL_TABLET | Freq: Every day | ORAL | 0 refills | Status: DC

## 2021-10-16 MED ORDER — METHYLPREDNISOLONE ACETATE 40 MG/ML IJ SUSP
40.0000 mg | Freq: Once | INTRAMUSCULAR | Status: AC
  Administered 2021-10-16: 40 mg via INTRA_ARTICULAR

## 2021-10-16 MED ORDER — TERBINAFINE HCL 250 MG PO TABS: 250.0000 mg | ORAL_TABLET | Freq: Every day | ORAL | 5 refills | Status: DC

## 2021-10-16 NOTE — Progress Notes (Deleted)
Total Protein 6.0 - 8.3 G/DL 7.2   Comment: Patients taking eltrombopag at doses >/= 100 mg daily may show falsely elevated values of 10% or greater.  Albumin  3.5 - 5.0 G/DL 4.2   Total Bilirubin 0.1 - 1.2 MG/DL 0.8   Comment: Patients taking eltrombopag at doses >/= 100 mg daily may show falsely elevated values of 10% or greater.  Alkaline Phosphatase 34 - 104 IU/L or U/L 49   AST (SGOT) 5 - 40 IU/L or U/L 13   ALT (SGPT) 5 - 50 IU/L or U/L 9   Anion Gap 4 - 14 MMOL/L 4   Est. GFR >=60 ML/MIN/1.73 M*2 >90     Meds ordered this encounter  Medications   terbinafine (LAMISIL) 250 MG tablet    Sig: Take 1 tablet (250 mg total) by mouth daily.    Dispense:  30 tablet    Refill:  5   meloxicam (MOBIC) 15 MG tablet    Sig: Take 1 tablet (15 mg total) by mouth daily.    Dispense:  30 tablet    Refill:  0

## 2021-10-16 NOTE — Progress Notes (Addendum)
Chief Complaint  Patient presents with   Knee Pain    Bilateral/ they hurt all the time, sometimes worse than others.Takes a little time to get start first thing in the morning.   I also have something wrong with my toes   The patient's daughter is here for interpretation.  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  He does have some trouble getting going in the morning but other than that is in pretty good condition  He walks without support he has a flexed knee gait he has a varus alignment to both knees his gait is not antalgic  Fortunately he is maintained at least 115 degrees of knee flexion despite the slight flexion contractures his knee is stable he has some tenderness over the medial joint line with small effusions  Imaging shows no progression of his arthritis from our most recent film a year ago but he does have grade 3 disease in both knees  His daughter and he are agreeable to restarting his meloxicam  2 injections  Procedure note for bilateral knee injections  Procedure note left knee injection verbal consent was obtained to inject left knee joint  Timeout was completed to confirm the site of injection  The medications used were 40 mg depomedrol and 3 cc of 1% lidocaine  Anesthesia was provided by ethyl chloride and the skin was prepped with alcohol.  After cleaning the skin with alcohol a 20-gauge needle was used to inject the left knee joint. There were no complications. A sterile bandage was applied.   Procedure note right knee injection verbal consent was obtained to inject right knee joint  Timeout was completed to confirm the site of injection  The medications used were 40 mg depomedrol and 3 cc of 1% lidocaine  Anesthesia was provided by ethyl chloride and the skin was prepped with alcohol.  After  cleaning the skin with alcohol a 20-gauge needle was used to inject the right knee joint. There were no complications. A sterile bandage was applied.   An 39-monthfollow-up for x-ray and reevaluation  Meds ordered this encounter  Medications   terbinafine (LAMISIL) 250 MG tablet    Sig: Take 1 tablet (250 mg total) by mouth daily.    Dispense:  30 tablet    Refill:  5   meloxicam (MOBIC) 15 MG tablet    Sig: Take 1 tablet (15 mg total) by mouth daily.    Dispense:  30 tablet    Refill:  0   methylPREDNISolone acetate (DEPO-MEDROL) injection 40 mg   methylPREDNISolone acetate (DEPO-MEDROL) injection 40 mg

## 2021-11-05 DIAGNOSIS — C7951 Secondary malignant neoplasm of bone: Secondary | ICD-10-CM | POA: Diagnosis not present

## 2021-11-18 DIAGNOSIS — H401131 Primary open-angle glaucoma, bilateral, mild stage: Secondary | ICD-10-CM | POA: Diagnosis not present

## 2021-11-18 DIAGNOSIS — H2513 Age-related nuclear cataract, bilateral: Secondary | ICD-10-CM | POA: Diagnosis not present

## 2021-11-18 DIAGNOSIS — H01001 Unspecified blepharitis right upper eyelid: Secondary | ICD-10-CM | POA: Diagnosis not present

## 2021-11-18 DIAGNOSIS — H18413 Arcus senilis, bilateral: Secondary | ICD-10-CM | POA: Diagnosis not present

## 2021-11-25 DIAGNOSIS — M549 Dorsalgia, unspecified: Secondary | ICD-10-CM | POA: Diagnosis not present

## 2021-11-25 DIAGNOSIS — Z9889 Other specified postprocedural states: Secondary | ICD-10-CM | POA: Diagnosis not present

## 2021-11-25 DIAGNOSIS — Z79899 Other long term (current) drug therapy: Secondary | ICD-10-CM | POA: Diagnosis not present

## 2021-11-25 DIAGNOSIS — C7951 Secondary malignant neoplasm of bone: Secondary | ICD-10-CM | POA: Diagnosis not present

## 2021-11-25 DIAGNOSIS — Z192 Hormone resistant malignancy status: Secondary | ICD-10-CM | POA: Diagnosis not present

## 2021-11-25 DIAGNOSIS — M79669 Pain in unspecified lower leg: Secondary | ICD-10-CM | POA: Diagnosis not present

## 2021-11-25 DIAGNOSIS — M170 Bilateral primary osteoarthritis of knee: Secondary | ICD-10-CM | POA: Diagnosis not present

## 2021-11-25 DIAGNOSIS — R110 Nausea: Secondary | ICD-10-CM | POA: Diagnosis not present

## 2021-11-27 ENCOUNTER — Telehealth: Payer: Self-pay | Admitting: Orthopedic Surgery

## 2021-11-27 ENCOUNTER — Other Ambulatory Visit: Payer: Self-pay | Admitting: Orthopedic Surgery

## 2021-11-27 DIAGNOSIS — M170 Bilateral primary osteoarthritis of knee: Secondary | ICD-10-CM

## 2021-11-27 DIAGNOSIS — G8929 Other chronic pain: Secondary | ICD-10-CM

## 2021-11-27 MED ORDER — PREDNISONE 10 MG (48) PO TBPK: ORAL_TABLET | Freq: Every day | ORAL | 0 refills | Status: DC

## 2021-11-27 NOTE — Telephone Encounter (Signed)
I called and spoke w/the pt's daughter, the patient wants to wait and see how he does over the weekend before he schedules an appointment.  He will call Monday to schedule if needed.

## 2021-11-27 NOTE — Telephone Encounter (Signed)
Someone called for the pt, lvm stating the pt's knees are so swollen he can barely walk.  Requesting an appointment for today, please advise.

## 2021-11-27 NOTE — Telephone Encounter (Signed)
He needs to rest, apply ice 3 x a dy for 30 min, he can try some prednisone

## 2021-11-27 NOTE — Telephone Encounter (Signed)
I called advised ice rest Dr Aline Brochure sent prednisone He still needs appointment next available thanks

## 2021-11-27 NOTE — Progress Notes (Signed)
Meds ordered this encounter  Medications   predniSONE (STERAPRED UNI-PAK 48 TAB) 10 MG (48) TBPK tablet    Sig: Take by mouth daily. 10 mg 12 days as directed    Dispense:  48 tablet    Refill:  0

## 2021-11-27 NOTE — Telephone Encounter (Signed)
I called spoke to his daughter his knees are very swollen and the Meloxicam is not helping he was in severe pain with swollen knees yesterday  Hope will reach out to patient for appointment scheduling can you offer another medication for him to try since the Meloxicam is not helping much he uses [facility] in [place]

## 2021-11-27 NOTE — Telephone Encounter (Signed)
We do not have availability for today please schedule for next available

## 2021-11-29 IMAGING — DX DG CHEST 2V
2 series · 2 of 2 positions shown · non-contrast
Comparison: None.

CLINICAL DATA: Chest pain, unspecified. Back injury at work 2
months ago. UPPER back pain radiates to the middle of the chest.
Symptoms started 3 weeks ago.

EXAM:
CHEST - 2 VIEW

[chest pa]
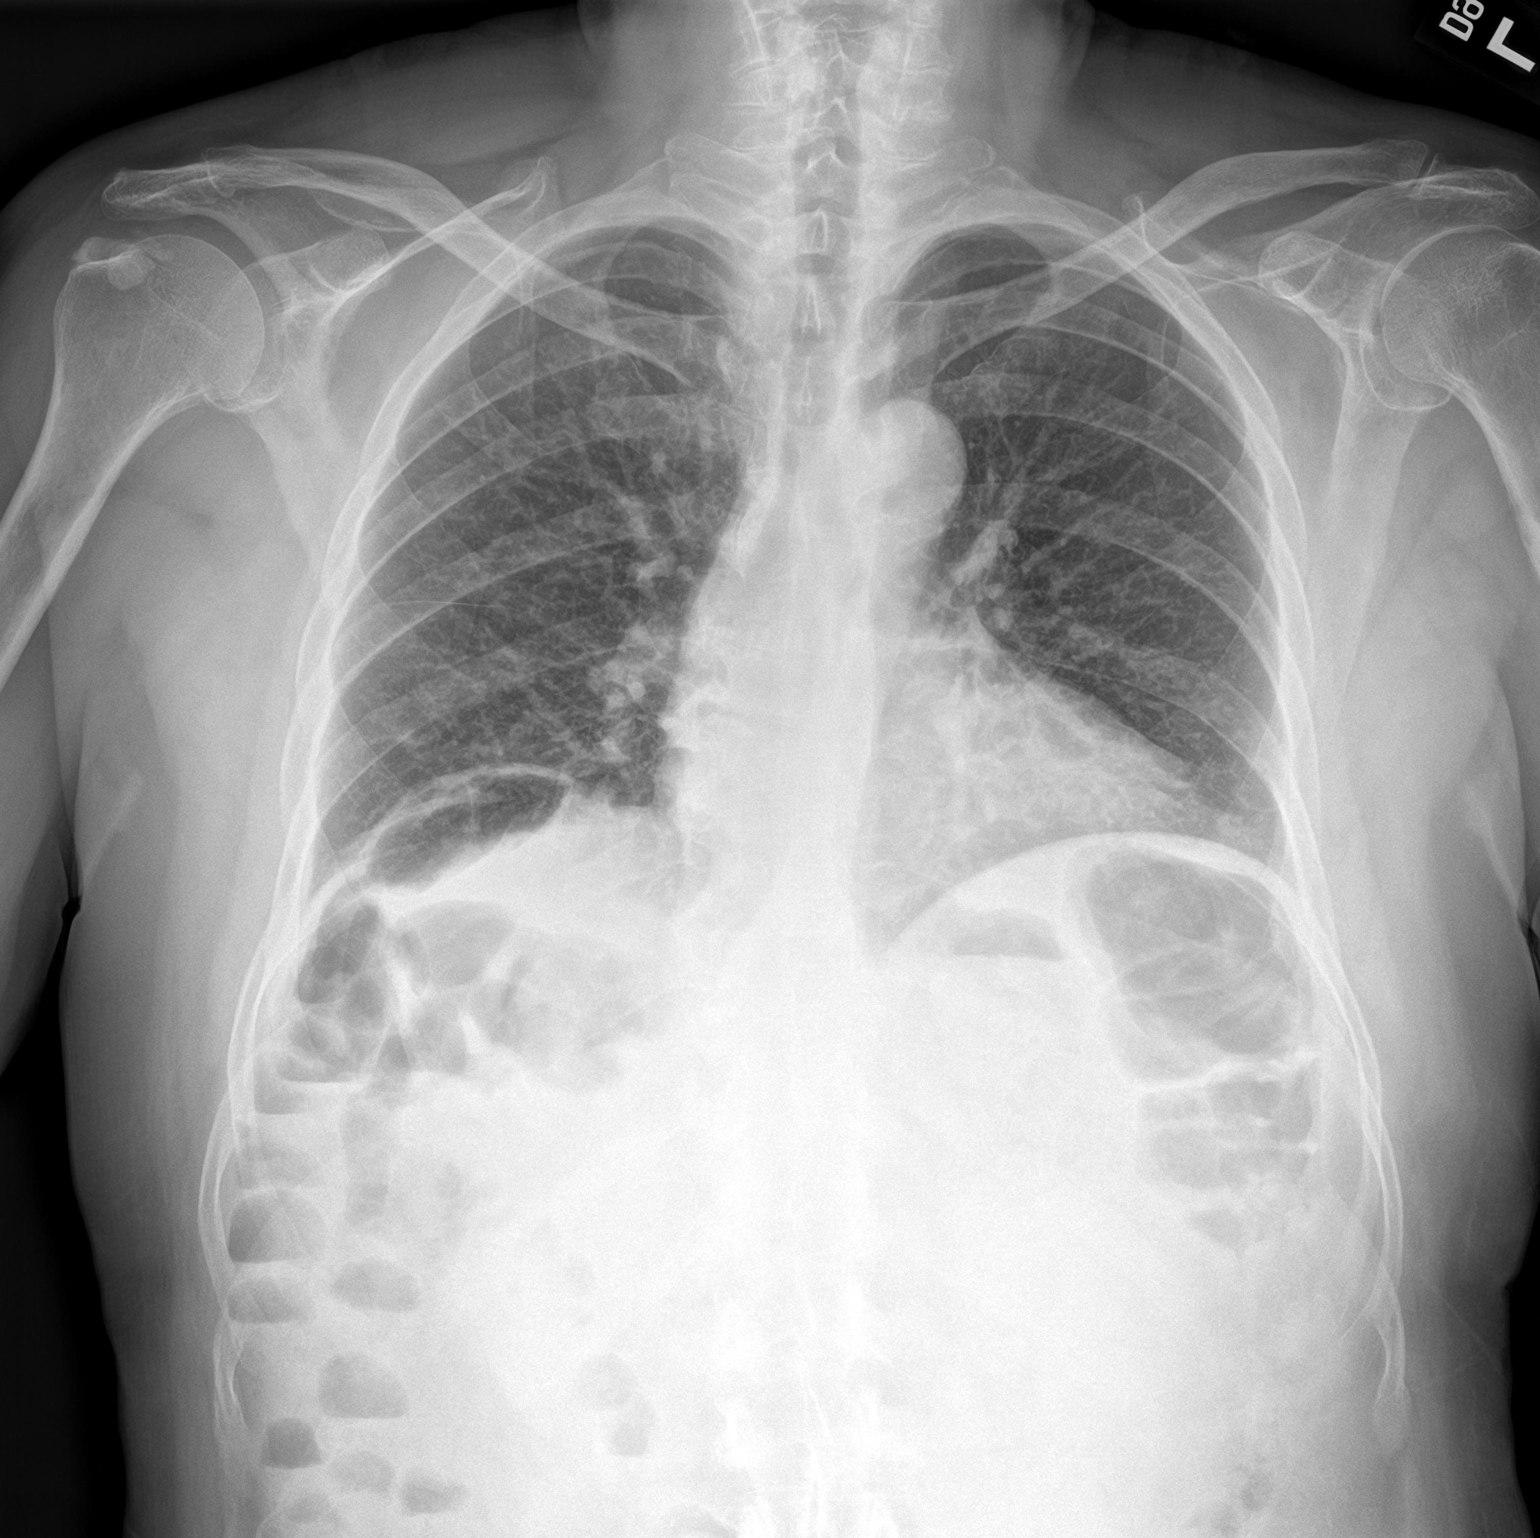

[chest lat]
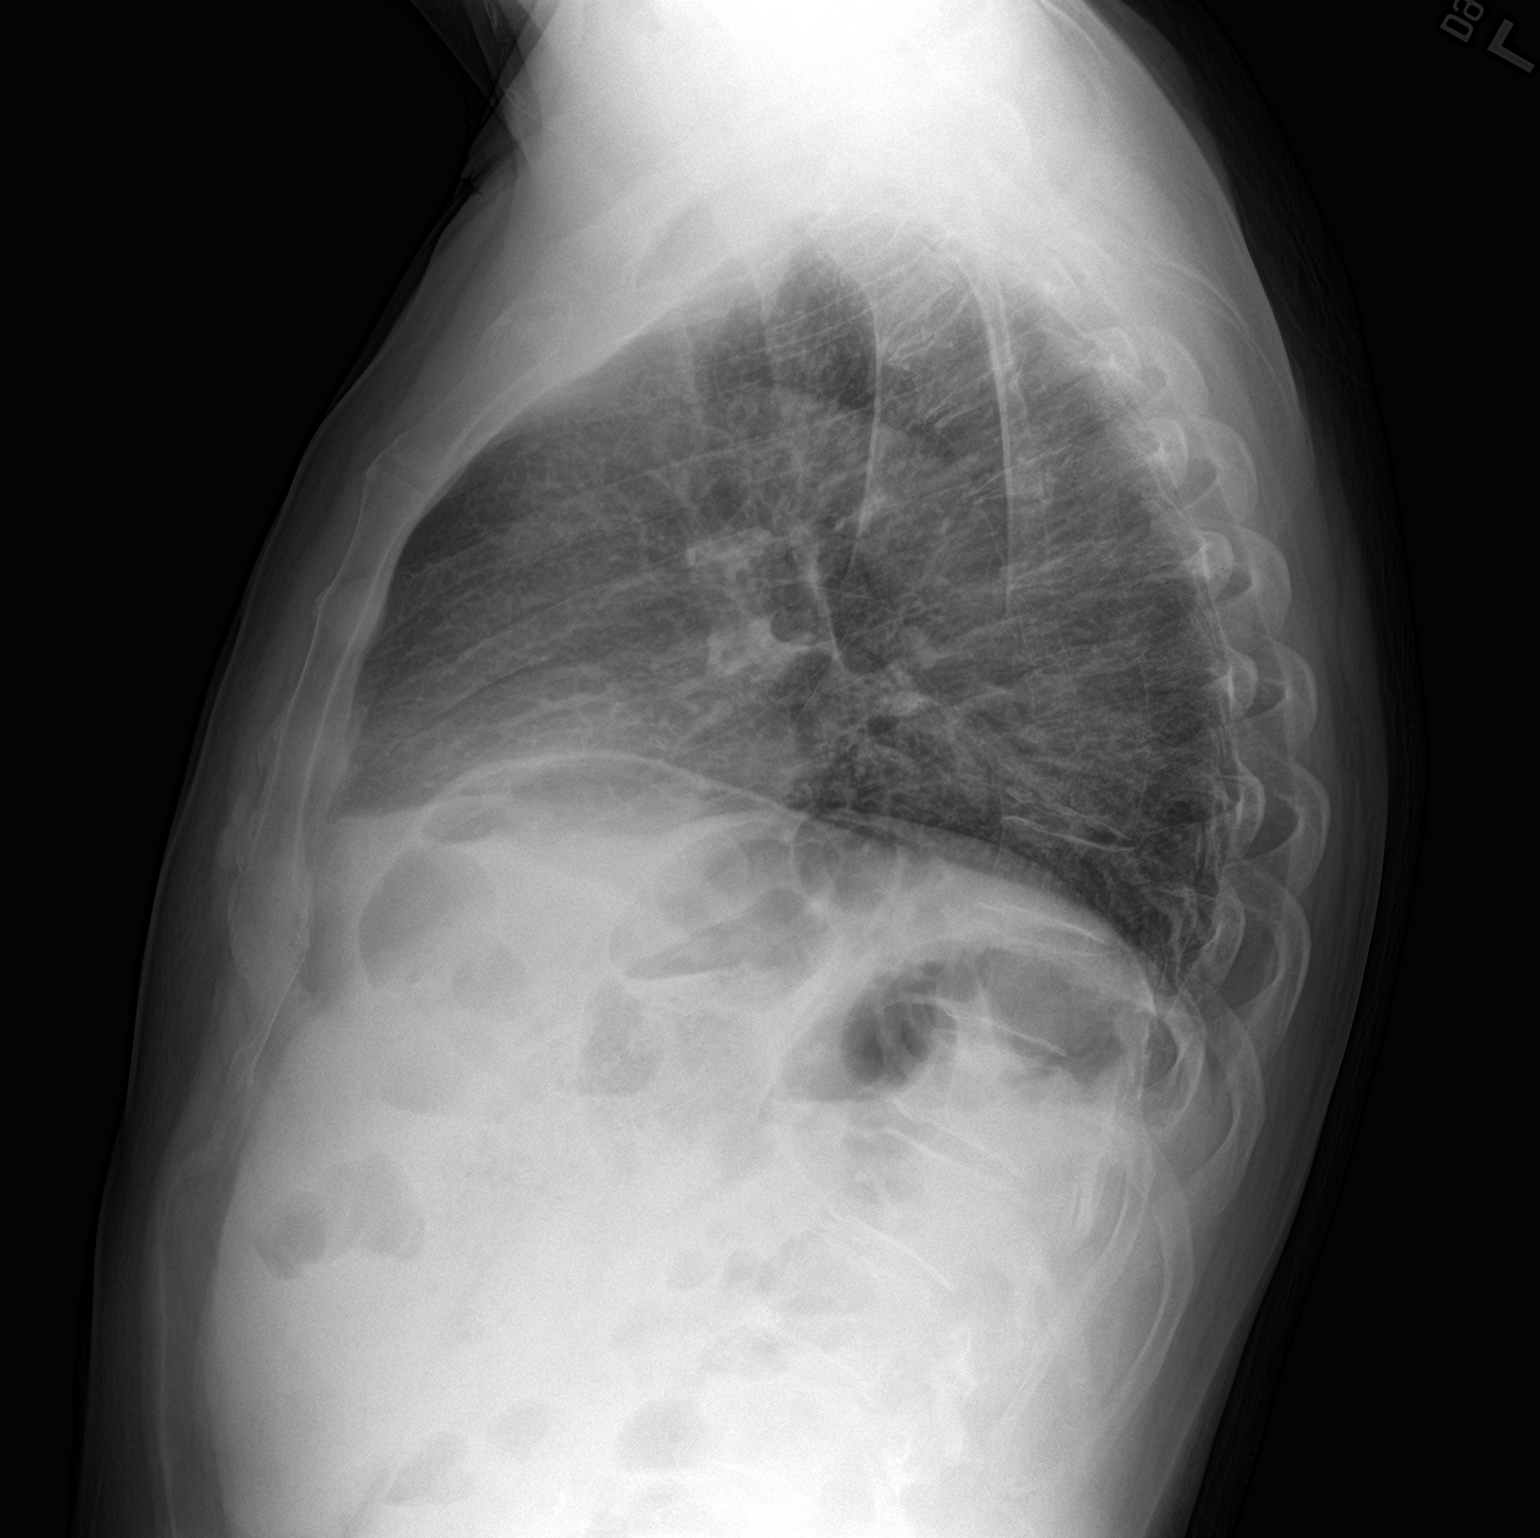

[2 of 2 positions shown; findings below may reference images not displayed]

FINDINGS: Low lung volumes. Heart size is normal. The lungs are free of focal
consolidations and pleural effusions. No pulmonary edema. There is
anterior wedge compression fracture of T9, and possibly T8 but
detail is limited.
IMPRESSION: 1. No evidence for acute cardiopulmonary abnormality.
2. Anterior wedge compression fracture of T9 and possibly T8.
Consider further evaluation with dedicated views of the thoracic
spine or MRI.

## 2021-12-10 ENCOUNTER — Ambulatory Visit: Payer: Medicare Other | Admitting: Orthopedic Surgery

## 2021-12-10 MED ORDER — MELOXICAM 15 MG PO TABS: 15.0000 mg | ORAL_TABLET | Freq: Every day | ORAL | 4 refills | Status: DC

## 2021-12-23 DIAGNOSIS — Z8572 Personal history of non-Hodgkin lymphomas: Secondary | ICD-10-CM | POA: Diagnosis not present

## 2021-12-23 DIAGNOSIS — M170 Bilateral primary osteoarthritis of knee: Secondary | ICD-10-CM | POA: Diagnosis not present

## 2021-12-23 DIAGNOSIS — M25561 Pain in right knee: Secondary | ICD-10-CM | POA: Diagnosis not present

## 2021-12-23 DIAGNOSIS — M79661 Pain in right lower leg: Secondary | ICD-10-CM | POA: Diagnosis not present

## 2021-12-23 DIAGNOSIS — S22089D Unspecified fracture of T11-T12 vertebra, subsequent encounter for fracture with routine healing: Secondary | ICD-10-CM | POA: Diagnosis not present

## 2021-12-23 DIAGNOSIS — Z192 Hormone resistant malignancy status: Secondary | ICD-10-CM | POA: Diagnosis not present

## 2021-12-23 DIAGNOSIS — S22069D Unspecified fracture of T7-T8 vertebra, subsequent encounter for fracture with routine healing: Secondary | ICD-10-CM | POA: Diagnosis not present

## 2021-12-23 DIAGNOSIS — Z9221 Personal history of antineoplastic chemotherapy: Secondary | ICD-10-CM | POA: Diagnosis not present

## 2021-12-23 DIAGNOSIS — K921 Melena: Secondary | ICD-10-CM | POA: Diagnosis not present

## 2022-02-02 DIAGNOSIS — M899 Disorder of bone, unspecified: Secondary | ICD-10-CM | POA: Diagnosis not present

## 2022-02-10 DIAGNOSIS — C8330 Diffuse large B-cell lymphoma, unspecified site: Secondary | ICD-10-CM | POA: Diagnosis not present

## 2022-02-10 DIAGNOSIS — C7951 Secondary malignant neoplasm of bone: Secondary | ICD-10-CM | POA: Diagnosis not present

## 2022-02-18 ENCOUNTER — Encounter: Payer: Self-pay | Admitting: Orthopedic Surgery

## 2022-02-18 ENCOUNTER — Ambulatory Visit: Payer: Medicare Other | Admitting: Orthopedic Surgery

## 2022-02-18 ENCOUNTER — Ambulatory Visit (INDEPENDENT_AMBULATORY_CARE_PROVIDER_SITE_OTHER): Payer: Medicare Other

## 2022-02-18 DIAGNOSIS — G8929 Other chronic pain: Secondary | ICD-10-CM | POA: Diagnosis not present

## 2022-02-18 DIAGNOSIS — M5441 Lumbago with sciatica, right side: Secondary | ICD-10-CM | POA: Diagnosis not present

## 2022-02-18 DIAGNOSIS — M25561 Pain in right knee: Secondary | ICD-10-CM

## 2022-02-18 DIAGNOSIS — M25562 Pain in left knee: Secondary | ICD-10-CM

## 2022-02-18 DIAGNOSIS — M4726 Other spondylosis with radiculopathy, lumbar region: Secondary | ICD-10-CM

## 2022-02-18 MED ORDER — PREDNISONE 10 MG (48) PO TBPK: ORAL_TABLET | Freq: Every day | ORAL | 0 refills | Status: DC

## 2022-02-18 MED ORDER — METHOCARBAMOL 500 MG PO TABS: 500.0000 mg | ORAL_TABLET | Freq: Three times a day (TID) | ORAL | 1 refills | Status: DC

## 2022-02-18 NOTE — Progress Notes (Signed)
Chief Complaint  Patient presents with   Back Pain    Back pain radiating down to right knee    67 year old male with 1 week history of back pain radiating to his right knee and right leg.  He started with some knee pain and knee swelling he is on meloxicam.  This then progressed to progressively worsening right lower extremity radicular symptoms with back pain  There are no red flags noted  Physical Exam Vitals and nursing note reviewed.  Constitutional:      Appearance: Normal appearance.  HENT:     Head: Normocephalic and atraumatic.  Eyes:     General: No scleral icterus.       Right eye: No discharge.        Left eye: No discharge.     Extraocular Movements: Extraocular movements intact.     Conjunctiva/sclera: Conjunctivae normal.     Pupils: Pupils are equal, round, and reactive to light.  Cardiovascular:     Rate and Rhythm: Normal rate.     Pulses: Normal pulses.  Skin:    General: Skin is warm and dry.     Capillary Refill: Capillary refill takes less than 2 seconds.  Neurological:     General: No focal deficit present.     Mental Status: He is alert and oriented to person, place, and time.     Gait: Gait abnormal.  Psychiatric:        Mood and Affect: Mood normal.        Behavior: Behavior normal.        Thought Content: Thought content normal.        Judgment: Judgment normal.     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    Dispense:  60 tablet    Refill:  1   predniSONE (STERAPRED UNI-PAK 48 TAB) 10 MG (48) TBPK tablet    Sig: Take by mouth daily. 10 mg 12 days as directed    Dispense:  48 tablet    Refill:  0   Follow-up as needed

## 2022-02-18 NOTE — Patient Instructions (Addendum)
10 days should be off resting   12 days stop the meloxicam

## 2022-03-16 DIAGNOSIS — H01002 Unspecified blepharitis right lower eyelid: Secondary | ICD-10-CM | POA: Diagnosis not present

## 2022-03-16 DIAGNOSIS — H401131 Primary open-angle glaucoma, bilateral, mild stage: Secondary | ICD-10-CM | POA: Diagnosis not present

## 2022-03-16 DIAGNOSIS — H01001 Unspecified blepharitis right upper eyelid: Secondary | ICD-10-CM | POA: Diagnosis not present

## 2022-03-16 DIAGNOSIS — H2513 Age-related nuclear cataract, bilateral: Secondary | ICD-10-CM | POA: Diagnosis not present

## 2022-04-19 ENCOUNTER — Ambulatory Visit: Payer: Medicare Other | Admitting: Orthopedic Surgery

## 2022-04-29 ENCOUNTER — Other Ambulatory Visit: Payer: Self-pay

## 2022-04-29 ENCOUNTER — Ambulatory Visit: Payer: Medicare Other | Admitting: Orthopedic Surgery

## 2022-04-29 ENCOUNTER — Other Ambulatory Visit (INDEPENDENT_AMBULATORY_CARE_PROVIDER_SITE_OTHER): Payer: Medicare Other

## 2022-04-29 ENCOUNTER — Encounter: Payer: Self-pay | Admitting: Orthopedic Surgery

## 2022-04-29 DIAGNOSIS — M25561 Pain in right knee: Secondary | ICD-10-CM

## 2022-04-29 DIAGNOSIS — M170 Bilateral primary osteoarthritis of knee: Secondary | ICD-10-CM

## 2022-04-29 DIAGNOSIS — M25562 Pain in left knee: Secondary | ICD-10-CM | POA: Diagnosis not present

## 2022-04-29 DIAGNOSIS — G8929 Other chronic pain: Secondary | ICD-10-CM

## 2022-04-29 MED ORDER — METHYLPREDNISOLONE ACETATE 40 MG/ML IJ SUSP
40.0000 mg | Freq: Once | INTRAMUSCULAR | Status: AC
  Administered 2022-04-29: 40 mg via INTRA_ARTICULAR

## 2022-04-29 MED ORDER — MELOXICAM 15 MG PO TABS
15.0000 mg | ORAL_TABLET | Freq: Every day | ORAL | 4 refills | Status: DC
Start: 2022-04-29 — End: 2022-10-28

## 2022-04-29 NOTE — Progress Notes (Signed)
Chief Complaint  Patient presents with   Knee Pain    Bilateral / feeling better     Encounter Diagnoses  Name Primary?   Chronic pain of left knee Yes   Chronic pain of right knee    Primary osteoarthritis of both knees     Brandon Norton comes in for routine 16-month follow-up  He says his radicular symptoms in his back and leg have resolved  He still having bilateral knee pain  He takes meloxicam for knee pain  He is still very active he works 2 jobs.  Today  Gait normal support needed no limp has bilateral varus knee alignment  No knee effusions today on the right or left.  Medial joint line tenderness persist bilaterally.  Mild flexion contractures noted in each knee but his overall range of motion still is approximate 125 degrees  His knees are stable muscle tone is normal  Imaging shows pretty much stable appearance of his arthritis in both medial compartments as well as the other 3 compartments is just at the medial side is definitely more significant.  He does have some peripheral osteophytes around the knee in both joints  Both knees were injected for symptomatic relief  Refill on the meloxicam  57-month follow-up for x-rays   Procedure note for bilateral knee injections  Procedure note left knee injection verbal consent was obtained to inject left knee joint  Timeout was completed to confirm the site of injection  The medications used were 40 mg depomedrol and 3 cc of 1% lidocaine  Anesthesia was provided by ethyl chloride and the skin was prepped with alcohol.  After cleaning the skin with alcohol a 20-gauge needle was used to inject the left knee joint. There were no complications. A sterile bandage was applied.   Procedure note right knee injection verbal consent was obtained to inject right knee joint  Timeout was completed to confirm the site of injection  The medications used were 40 mg depomedrol and 3 cc of 1% lidocaine  Anesthesia was provided  by ethyl chloride and the skin was prepped with alcohol.  After cleaning the skin with alcohol a 20-gauge needle was used to inject the right knee joint. There were no complications. A sterile bandage was applied.

## 2022-05-12 DIAGNOSIS — M545 Low back pain, unspecified: Secondary | ICD-10-CM | POA: Diagnosis not present

## 2022-05-19 DIAGNOSIS — M549 Dorsalgia, unspecified: Secondary | ICD-10-CM | POA: Diagnosis not present

## 2022-05-19 DIAGNOSIS — Z192 Hormone resistant malignancy status: Secondary | ICD-10-CM | POA: Diagnosis not present

## 2022-05-19 DIAGNOSIS — M545 Low back pain, unspecified: Secondary | ICD-10-CM | POA: Diagnosis not present

## 2022-09-22 DIAGNOSIS — H01001 Unspecified blepharitis right upper eyelid: Secondary | ICD-10-CM | POA: Diagnosis not present

## 2022-09-22 DIAGNOSIS — H01002 Unspecified blepharitis right lower eyelid: Secondary | ICD-10-CM | POA: Diagnosis not present

## 2022-09-22 DIAGNOSIS — H2513 Age-related nuclear cataract, bilateral: Secondary | ICD-10-CM | POA: Diagnosis not present

## 2022-09-22 DIAGNOSIS — H401131 Primary open-angle glaucoma, bilateral, mild stage: Secondary | ICD-10-CM | POA: Diagnosis not present

## 2022-09-30 DIAGNOSIS — M47816 Spondylosis without myelopathy or radiculopathy, lumbar region: Secondary | ICD-10-CM | POA: Diagnosis not present

## 2022-09-30 DIAGNOSIS — D518 Other vitamin B12 deficiency anemias: Secondary | ICD-10-CM | POA: Diagnosis not present

## 2022-09-30 DIAGNOSIS — E782 Mixed hyperlipidemia: Secondary | ICD-10-CM | POA: Diagnosis not present

## 2022-09-30 DIAGNOSIS — Z0001 Encounter for general adult medical examination with abnormal findings: Secondary | ICD-10-CM | POA: Diagnosis not present

## 2022-09-30 DIAGNOSIS — Z23 Encounter for immunization: Secondary | ICD-10-CM | POA: Diagnosis not present

## 2022-09-30 DIAGNOSIS — D649 Anemia, unspecified: Secondary | ICD-10-CM | POA: Diagnosis not present

## 2022-09-30 DIAGNOSIS — E559 Vitamin D deficiency, unspecified: Secondary | ICD-10-CM | POA: Diagnosis not present

## 2022-09-30 DIAGNOSIS — G9332 Myalgic encephalomyelitis/chronic fatigue syndrome: Secondary | ICD-10-CM | POA: Diagnosis not present

## 2022-10-06 DIAGNOSIS — M545 Low back pain, unspecified: Secondary | ICD-10-CM | POA: Diagnosis not present

## 2022-10-11 DIAGNOSIS — C7951 Secondary malignant neoplasm of bone: Secondary | ICD-10-CM | POA: Diagnosis not present

## 2022-10-25 DIAGNOSIS — C7951 Secondary malignant neoplasm of bone: Secondary | ICD-10-CM | POA: Diagnosis not present

## 2022-10-28 ENCOUNTER — Other Ambulatory Visit: Payer: Self-pay

## 2022-10-28 ENCOUNTER — Ambulatory Visit: Payer: Medicare Other | Admitting: Orthopedic Surgery

## 2022-10-28 ENCOUNTER — Encounter: Payer: Self-pay | Admitting: Orthopedic Surgery

## 2022-10-28 ENCOUNTER — Other Ambulatory Visit (INDEPENDENT_AMBULATORY_CARE_PROVIDER_SITE_OTHER): Payer: Medicare Other

## 2022-10-28 VITALS — BP 118/73 | HR 67 | Ht 65.0 in | Wt 165.0 lb

## 2022-10-28 DIAGNOSIS — M25562 Pain in left knee: Secondary | ICD-10-CM

## 2022-10-28 DIAGNOSIS — M170 Bilateral primary osteoarthritis of knee: Secondary | ICD-10-CM

## 2022-10-28 DIAGNOSIS — G8929 Other chronic pain: Secondary | ICD-10-CM

## 2022-10-28 DIAGNOSIS — M25561 Pain in right knee: Secondary | ICD-10-CM | POA: Diagnosis not present

## 2022-10-28 MED ORDER — MELOXICAM 15 MG PO TABS: 15.0000 mg | ORAL_TABLET | Freq: Every day | ORAL | 4 refills | Status: DC

## 2022-10-28 MED ORDER — METHYLPREDNISOLONE ACETATE 40 MG/ML IJ SUSP
40.0000 mg | Freq: Once | INTRAMUSCULAR | Status: AC
  Administered 2022-10-28: 40 mg via INTRA_ARTICULAR

## 2022-10-28 NOTE — Addendum Note (Signed)
Addended byCaffie Damme on: 10/28/2022 04:22 PM   Modules accepted: Orders

## 2022-10-28 NOTE — Progress Notes (Signed)
   VISIT TYPE: FOLLOW UP   Chief Complaint  Patient presents with   Knee Pain    Bilateral/ having pain in the evenings after work, after sitting for awhile and getting up has pain and in the car getting out after riding for awhile. 10/25/22 1st radiation treatment for prostate cancer.     Encounter Diagnoses  Name Primary?   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  Recommend injections meloxicam nonoperative treatment  HPI: Follow-up visit for this 67 year old male with bilateral knee pain from bilateral osteoarthritis   BP 118/73   Pulse 67   Ht 5\' 5"  (1.651 m)   Wt 165 lb (74.8 kg)   BMI 27.46 kg/m   Ortho Exam No assistive devices at this time has bilateral varus deformities of the knee tenderness over the medial joint line slight flexion contractures that are less than 5 degrees  The arc of motion is 120 degrees  Both knees remain stable  Imaging bilateral knee x-rays show varus alignment both knees more narrowing medial than lateral  Lawrence classification indicates definite joint space narrowing some sclerosis mild deformity indicating grade 4 disease bilaterally  A/P Encounter Diagnoses  Name Primary?   Chronic pain of left knee    Chronic pain of right knee    Primary osteoarthritis of both knees Yes    Meds ordered this encounter  Medications   meloxicam (MOBIC) 15 MG tablet    Sig: Take 1 tablet (15 mg total) by mouth daily.    Dispense:  30 tablet    Refill:  4    Procedure note for bilateral knee injections  Procedure note left knee injection verbal consent was obtained to inject left knee joint  Timeout was completed to confirm the site of injection  The medications used were 40 mg depomedrol and 3 cc  of 1% lidocaine  Anesthesia was provided by ethyl chloride and the skin was prepped with alcohol.  After cleaning the skin with alcohol a 20-gauge needle was used to inject the left knee joint. There were no complications. A sterile bandage was applied.   Procedure note right knee injection verbal consent was obtained to inject right knee joint  Timeout was completed to confirm the site of injection  The medications used were 40 mg depomedrol and 3 cc of 1% lidocaine  Anesthesia was provided by ethyl chloride and the skin was prepped with alcohol.  After cleaning the skin with alcohol a 20-gauge needle was used to inject the right knee joint. There were no complications. A sterile bandage was applied.

## 2022-11-23 DIAGNOSIS — C7951 Secondary malignant neoplasm of bone: Secondary | ICD-10-CM | POA: Diagnosis not present

## 2022-12-21 DIAGNOSIS — C7951 Secondary malignant neoplasm of bone: Secondary | ICD-10-CM | POA: Diagnosis not present

## 2023-01-07 ENCOUNTER — Other Ambulatory Visit: Payer: Self-pay | Admitting: Orthopaedic Surgery

## 2023-01-07 ENCOUNTER — Other Ambulatory Visit: Payer: Self-pay | Admitting: Orthopedic Surgery

## 2023-01-07 DIAGNOSIS — B351 Tinea unguium: Secondary | ICD-10-CM

## 2023-01-07 DIAGNOSIS — G8929 Other chronic pain: Secondary | ICD-10-CM

## 2023-01-07 DIAGNOSIS — M170 Bilateral primary osteoarthritis of knee: Secondary | ICD-10-CM

## 2023-01-19 DIAGNOSIS — C7951 Secondary malignant neoplasm of bone: Secondary | ICD-10-CM | POA: Diagnosis not present

## 2023-01-19 DIAGNOSIS — K219 Gastro-esophageal reflux disease without esophagitis: Secondary | ICD-10-CM | POA: Diagnosis not present

## 2023-02-16 DIAGNOSIS — C7951 Secondary malignant neoplasm of bone: Secondary | ICD-10-CM | POA: Diagnosis not present

## 2023-03-18 DIAGNOSIS — C7951 Secondary malignant neoplasm of bone: Secondary | ICD-10-CM | POA: Diagnosis not present

## 2023-04-28 ENCOUNTER — Ambulatory Visit: Payer: Medicare Other | Admitting: Orthopedic Surgery

## 2023-04-29 ENCOUNTER — Ambulatory Visit: Payer: Medicare Other | Admitting: Orthopedic Surgery

## 2023-05-06 ENCOUNTER — Other Ambulatory Visit: Payer: Self-pay

## 2023-05-06 ENCOUNTER — Ambulatory Visit: Admitting: Orthopedic Surgery

## 2023-05-06 ENCOUNTER — Encounter: Payer: Self-pay | Admitting: Orthopedic Surgery

## 2023-05-06 ENCOUNTER — Other Ambulatory Visit (INDEPENDENT_AMBULATORY_CARE_PROVIDER_SITE_OTHER): Payer: Self-pay

## 2023-05-06 DIAGNOSIS — M170 Bilateral primary osteoarthritis of knee: Secondary | ICD-10-CM

## 2023-05-06 MED ORDER — METHYLPREDNISOLONE ACETATE 40 MG/ML IJ SUSP
40.0000 mg | Freq: Once | INTRAMUSCULAR | Status: AC
  Administered 2023-05-06: 40 mg via INTRA_ARTICULAR

## 2023-05-06 NOTE — Addendum Note (Signed)
 Addended byArla Lab on: 05/06/2023 11:35 AM   Modules accepted: Orders

## 2023-05-06 NOTE — Progress Notes (Signed)
   There were no vitals taken for this visit.  There is no height or weight on file to calculate BMI.  Chief Complaint  Patient presents with   Knee Pain    Both     Encounter Diagnosis  Name Primary?   Primary osteoarthritis of both knees Yes    DOI/DOS/ Date: ongoing/ xrays today  Unchanged

## 2023-05-06 NOTE — Progress Notes (Signed)
 Follow-up visit  Encounter Diagnosis  Name Primary?   Primary osteoarthritis of both knees Yes    Brandon Norton is followed for OA both knees  He does well with just Tylenol  and injections and meloxicam   He is here for 87-month follow-up  Imaging studies are obtained  DG Knee 4 Views W/Patella Right Result Date: 05/06/2023 Right knee History of osteoarthritis Both knees show significant varus this is worse when the knees are flexed Joint space narrowing severe with minimal osteophytes Impression severe grade 4 OA right knee   DG Knee 4 Views W/Patella Left Result Date: 05/06/2023 Knee films left knee History of osteoarthritis On the anterior posterior x-rays both knees are in varus both knees have major joint space narrowing medially with gapping of the lateral side some osteopenia is noted minimal osteophytes are noted the tibial spines are peaked Grade 4A OA of the left knee     Assessment and plan 68 year old male OA both knees several years currently having complications from prostate cancer treatment  Repeat injections  Continue Tylenol   Meloxicam   Recheck 6 months  Procedure note for bilateral knee injections  Procedure note left knee injection verbal consent was obtained to inject left knee joint  Timeout was completed to confirm the site of injection  The medications used were 40 mg depomedrol and 3 cc of 1% lidocaine   Anesthesia was provided by ethyl chloride and the skin was prepped with alcohol.  After cleaning the skin with alcohol a 20-gauge needle was used to inject the left knee joint. There were no complications. A sterile bandage was applied.   Procedure note right knee injection verbal consent was obtained to inject right knee joint  Timeout was completed to confirm the site of injection  The medications used were 40 mg depomedrol and 3 cc of 1% lidocaine   Anesthesia was provided by ethyl chloride and the skin was prepped with alcohol.  After  cleaning the skin with alcohol a 20-gauge needle was used to inject the right knee joint. There were no complications. A sterile bandage was applied.

## 2023-05-17 DIAGNOSIS — H2513 Age-related nuclear cataract, bilateral: Secondary | ICD-10-CM | POA: Diagnosis not present

## 2023-05-17 DIAGNOSIS — H01001 Unspecified blepharitis right upper eyelid: Secondary | ICD-10-CM | POA: Diagnosis not present

## 2023-05-17 DIAGNOSIS — H01002 Unspecified blepharitis right lower eyelid: Secondary | ICD-10-CM | POA: Diagnosis not present

## 2023-05-17 DIAGNOSIS — H401131 Primary open-angle glaucoma, bilateral, mild stage: Secondary | ICD-10-CM | POA: Diagnosis not present

## 2023-06-02 DIAGNOSIS — R9389 Abnormal findings on diagnostic imaging of other specified body structures: Secondary | ICD-10-CM | POA: Diagnosis not present

## 2023-06-15 DIAGNOSIS — C8330 Diffuse large B-cell lymphoma, unspecified site: Secondary | ICD-10-CM | POA: Diagnosis not present

## 2023-06-15 DIAGNOSIS — Z923 Personal history of irradiation: Secondary | ICD-10-CM | POA: Diagnosis not present

## 2023-06-15 DIAGNOSIS — M545 Low back pain, unspecified: Secondary | ICD-10-CM | POA: Diagnosis not present

## 2023-06-15 DIAGNOSIS — C7949 Secondary malignant neoplasm of other parts of nervous system: Secondary | ICD-10-CM | POA: Diagnosis not present

## 2023-06-15 DIAGNOSIS — R110 Nausea: Secondary | ICD-10-CM | POA: Diagnosis not present

## 2023-06-15 DIAGNOSIS — T66XXXA Radiation sickness, unspecified, initial encounter: Secondary | ICD-10-CM | POA: Diagnosis not present

## 2023-06-15 DIAGNOSIS — Z192 Hormone resistant malignancy status: Secondary | ICD-10-CM | POA: Diagnosis not present

## 2023-06-15 DIAGNOSIS — C7951 Secondary malignant neoplasm of bone: Secondary | ICD-10-CM | POA: Diagnosis not present

## 2023-06-15 DIAGNOSIS — C7952 Secondary malignant neoplasm of bone marrow: Secondary | ICD-10-CM | POA: Diagnosis not present

## 2023-06-15 DIAGNOSIS — Z9221 Personal history of antineoplastic chemotherapy: Secondary | ICD-10-CM | POA: Diagnosis not present

## 2023-06-20 DIAGNOSIS — Z923 Personal history of irradiation: Secondary | ICD-10-CM | POA: Diagnosis not present

## 2023-06-20 DIAGNOSIS — Z9221 Personal history of antineoplastic chemotherapy: Secondary | ICD-10-CM | POA: Diagnosis not present

## 2023-06-20 DIAGNOSIS — R110 Nausea: Secondary | ICD-10-CM | POA: Diagnosis not present

## 2023-06-20 DIAGNOSIS — Z192 Hormone resistant malignancy status: Secondary | ICD-10-CM | POA: Diagnosis not present

## 2023-06-20 DIAGNOSIS — T66XXXA Radiation sickness, unspecified, initial encounter: Secondary | ICD-10-CM | POA: Diagnosis not present

## 2023-06-20 DIAGNOSIS — M545 Low back pain, unspecified: Secondary | ICD-10-CM | POA: Diagnosis not present

## 2023-06-20 DIAGNOSIS — C7949 Secondary malignant neoplasm of other parts of nervous system: Secondary | ICD-10-CM | POA: Diagnosis not present

## 2023-06-20 DIAGNOSIS — C8330 Diffuse large B-cell lymphoma, unspecified site: Secondary | ICD-10-CM | POA: Diagnosis not present

## 2023-06-22 DIAGNOSIS — R110 Nausea: Secondary | ICD-10-CM | POA: Diagnosis not present

## 2023-06-22 DIAGNOSIS — Z923 Personal history of irradiation: Secondary | ICD-10-CM | POA: Diagnosis not present

## 2023-06-22 DIAGNOSIS — Z192 Hormone resistant malignancy status: Secondary | ICD-10-CM | POA: Diagnosis not present

## 2023-06-22 DIAGNOSIS — C8330 Diffuse large B-cell lymphoma, unspecified site: Secondary | ICD-10-CM | POA: Diagnosis not present

## 2023-06-22 DIAGNOSIS — Z9221 Personal history of antineoplastic chemotherapy: Secondary | ICD-10-CM | POA: Diagnosis not present

## 2023-06-22 DIAGNOSIS — M545 Low back pain, unspecified: Secondary | ICD-10-CM | POA: Diagnosis not present

## 2023-06-22 DIAGNOSIS — C7949 Secondary malignant neoplasm of other parts of nervous system: Secondary | ICD-10-CM | POA: Diagnosis not present

## 2023-06-22 DIAGNOSIS — T66XXXA Radiation sickness, unspecified, initial encounter: Secondary | ICD-10-CM | POA: Diagnosis not present

## 2023-06-28 DIAGNOSIS — M545 Low back pain, unspecified: Secondary | ICD-10-CM | POA: Diagnosis not present

## 2023-06-28 DIAGNOSIS — T66XXXA Radiation sickness, unspecified, initial encounter: Secondary | ICD-10-CM | POA: Diagnosis not present

## 2023-06-28 DIAGNOSIS — C7949 Secondary malignant neoplasm of other parts of nervous system: Secondary | ICD-10-CM | POA: Diagnosis not present

## 2023-06-28 DIAGNOSIS — Z9221 Personal history of antineoplastic chemotherapy: Secondary | ICD-10-CM | POA: Diagnosis not present

## 2023-06-28 DIAGNOSIS — Z192 Hormone resistant malignancy status: Secondary | ICD-10-CM | POA: Diagnosis not present

## 2023-06-28 DIAGNOSIS — C8330 Diffuse large B-cell lymphoma, unspecified site: Secondary | ICD-10-CM | POA: Diagnosis not present

## 2023-06-28 DIAGNOSIS — R110 Nausea: Secondary | ICD-10-CM | POA: Diagnosis not present

## 2023-06-28 DIAGNOSIS — Z923 Personal history of irradiation: Secondary | ICD-10-CM | POA: Diagnosis not present

## 2023-06-29 DIAGNOSIS — Z923 Personal history of irradiation: Secondary | ICD-10-CM | POA: Diagnosis not present

## 2023-06-29 DIAGNOSIS — C8330 Diffuse large B-cell lymphoma, unspecified site: Secondary | ICD-10-CM | POA: Diagnosis not present

## 2023-06-29 DIAGNOSIS — C7949 Secondary malignant neoplasm of other parts of nervous system: Secondary | ICD-10-CM | POA: Diagnosis not present

## 2023-06-29 DIAGNOSIS — Z192 Hormone resistant malignancy status: Secondary | ICD-10-CM | POA: Diagnosis not present

## 2023-06-29 DIAGNOSIS — R110 Nausea: Secondary | ICD-10-CM | POA: Diagnosis not present

## 2023-06-29 DIAGNOSIS — T66XXXA Radiation sickness, unspecified, initial encounter: Secondary | ICD-10-CM | POA: Diagnosis not present

## 2023-06-29 DIAGNOSIS — Z510 Encounter for antineoplastic radiation therapy: Secondary | ICD-10-CM | POA: Diagnosis not present

## 2023-06-29 DIAGNOSIS — Z9221 Personal history of antineoplastic chemotherapy: Secondary | ICD-10-CM | POA: Diagnosis not present

## 2023-06-29 DIAGNOSIS — M545 Low back pain, unspecified: Secondary | ICD-10-CM | POA: Diagnosis not present

## 2023-06-30 DIAGNOSIS — R110 Nausea: Secondary | ICD-10-CM | POA: Diagnosis not present

## 2023-06-30 DIAGNOSIS — T66XXXA Radiation sickness, unspecified, initial encounter: Secondary | ICD-10-CM | POA: Diagnosis not present

## 2023-06-30 DIAGNOSIS — C8330 Diffuse large B-cell lymphoma, unspecified site: Secondary | ICD-10-CM | POA: Diagnosis not present

## 2023-06-30 DIAGNOSIS — M545 Low back pain, unspecified: Secondary | ICD-10-CM | POA: Diagnosis not present

## 2023-06-30 DIAGNOSIS — Z923 Personal history of irradiation: Secondary | ICD-10-CM | POA: Diagnosis not present

## 2023-06-30 DIAGNOSIS — Z9221 Personal history of antineoplastic chemotherapy: Secondary | ICD-10-CM | POA: Diagnosis not present

## 2023-06-30 DIAGNOSIS — Z192 Hormone resistant malignancy status: Secondary | ICD-10-CM | POA: Diagnosis not present

## 2023-07-01 DIAGNOSIS — Z9221 Personal history of antineoplastic chemotherapy: Secondary | ICD-10-CM | POA: Diagnosis not present

## 2023-07-01 DIAGNOSIS — C8330 Diffuse large B-cell lymphoma, unspecified site: Secondary | ICD-10-CM | POA: Diagnosis not present

## 2023-07-01 DIAGNOSIS — M545 Low back pain, unspecified: Secondary | ICD-10-CM | POA: Diagnosis not present

## 2023-07-01 DIAGNOSIS — Z923 Personal history of irradiation: Secondary | ICD-10-CM | POA: Diagnosis not present

## 2023-07-01 DIAGNOSIS — Z192 Hormone resistant malignancy status: Secondary | ICD-10-CM | POA: Diagnosis not present

## 2023-07-01 DIAGNOSIS — T66XXXA Radiation sickness, unspecified, initial encounter: Secondary | ICD-10-CM | POA: Diagnosis not present

## 2023-07-01 DIAGNOSIS — R110 Nausea: Secondary | ICD-10-CM | POA: Diagnosis not present

## 2023-07-04 DIAGNOSIS — Z510 Encounter for antineoplastic radiation therapy: Secondary | ICD-10-CM | POA: Diagnosis not present

## 2023-07-04 DIAGNOSIS — Z923 Personal history of irradiation: Secondary | ICD-10-CM | POA: Diagnosis not present

## 2023-07-04 DIAGNOSIS — C8330 Diffuse large B-cell lymphoma, unspecified site: Secondary | ICD-10-CM | POA: Diagnosis not present

## 2023-07-04 DIAGNOSIS — M545 Low back pain, unspecified: Secondary | ICD-10-CM | POA: Diagnosis not present

## 2023-07-04 DIAGNOSIS — R110 Nausea: Secondary | ICD-10-CM | POA: Diagnosis not present

## 2023-07-04 DIAGNOSIS — Z9221 Personal history of antineoplastic chemotherapy: Secondary | ICD-10-CM | POA: Diagnosis not present

## 2023-07-04 DIAGNOSIS — T66XXXA Radiation sickness, unspecified, initial encounter: Secondary | ICD-10-CM | POA: Diagnosis not present

## 2023-07-04 DIAGNOSIS — Z192 Hormone resistant malignancy status: Secondary | ICD-10-CM | POA: Diagnosis not present

## 2023-07-04 DIAGNOSIS — C7949 Secondary malignant neoplasm of other parts of nervous system: Secondary | ICD-10-CM | POA: Diagnosis not present

## 2023-07-05 DIAGNOSIS — C8330 Diffuse large B-cell lymphoma, unspecified site: Secondary | ICD-10-CM | POA: Diagnosis not present

## 2023-07-05 DIAGNOSIS — Z192 Hormone resistant malignancy status: Secondary | ICD-10-CM | POA: Diagnosis not present

## 2023-07-05 DIAGNOSIS — M545 Low back pain, unspecified: Secondary | ICD-10-CM | POA: Diagnosis not present

## 2023-07-05 DIAGNOSIS — T66XXXA Radiation sickness, unspecified, initial encounter: Secondary | ICD-10-CM | POA: Diagnosis not present

## 2023-07-05 DIAGNOSIS — Z9221 Personal history of antineoplastic chemotherapy: Secondary | ICD-10-CM | POA: Diagnosis not present

## 2023-07-05 DIAGNOSIS — Z923 Personal history of irradiation: Secondary | ICD-10-CM | POA: Diagnosis not present

## 2023-07-05 DIAGNOSIS — R110 Nausea: Secondary | ICD-10-CM | POA: Diagnosis not present

## 2023-11-04 ENCOUNTER — Ambulatory Visit: Admitting: Orthopedic Surgery

## 2023-11-10 ENCOUNTER — Ambulatory Visit (INDEPENDENT_AMBULATORY_CARE_PROVIDER_SITE_OTHER): Admitting: Orthopedic Surgery

## 2023-11-10 DIAGNOSIS — C8330 Diffuse large B-cell lymphoma, unspecified site: Secondary | ICD-10-CM

## 2023-11-10 DIAGNOSIS — M170 Bilateral primary osteoarthritis of knee: Secondary | ICD-10-CM

## 2023-11-10 DIAGNOSIS — C61 Malignant neoplasm of prostate: Secondary | ICD-10-CM

## 2023-11-10 DIAGNOSIS — G8929 Other chronic pain: Secondary | ICD-10-CM

## 2023-11-10 NOTE — Progress Notes (Signed)
    11/10/2023   Chief Complaint  Patient presents with   Knee Pain    Feeling better    Encounter Diagnoses  Name Primary?   Chronic pain of left knee Yes   Chronic pain of right knee    Primary osteoarthritis of both knees    Diffuse large B-cell lymphoma, unspecified body region [facility])    Prostate CA [facility])     What pharmacy do you use ? [facility]  Did you get better, worse or no change (Answer below)   Improved  Currently the patient is undergoing treatment for cancer he did apply for hospice.  He is on morphine and tramadol  he is got good pain relief from that  His knees do not show any swelling today his range of motion is very good there is no tenderness around the joint so we opted to not inject  Follow-up 6 months or sooner if needed for injection

## 2023-11-10 NOTE — Progress Notes (Signed)
    11/10/2023   Chief Complaint  Patient presents with   Knee Pain    Feeling better    Encounter Diagnoses  Name Primary?   Chronic pain of left knee Yes   Chronic pain of right knee    Primary osteoarthritis of both knees    Diffuse large B-cell lymphoma, unspecified body region [facility])    Prostate [facility])     What pharmacy do you use ? [facility]  Did you get better, worse or no change (Answer below)   Improved

## 2024-02-06 ENCOUNTER — Telehealth: Payer: Self-pay | Admitting: Orthopedic Surgery

## 2024-02-06 NOTE — Telephone Encounter (Signed)
 FYI - received a message today that this pt has passed away.  I will send a card from the office.

## 2024-02-08 NOTE — Telephone Encounter (Signed)
 Dickey, please mail a card for this pt, he passed away.

## 2024-02-12 DEATH — deceased

## 2024-05-10 ENCOUNTER — Ambulatory Visit: Admitting: Orthopedic Surgery
